# Patient Record
Sex: Female | Born: 1940 | Race: White | Hispanic: No | Smoking: Current every day smoker
Health system: Southern US, Community
[De-identification: ages and names within clinical notes are randomized; demographics above are authoritative.]

## PROBLEM LIST (undated history)

## (undated) DIAGNOSIS — R0602 Shortness of breath: Secondary | ICD-10-CM

## (undated) DIAGNOSIS — T4145XA Adverse effect of unspecified anesthetic, initial encounter: Secondary | ICD-10-CM

## (undated) DIAGNOSIS — G039 Meningitis, unspecified: Secondary | ICD-10-CM

## (undated) DIAGNOSIS — Z8719 Personal history of other diseases of the digestive system: Secondary | ICD-10-CM

## (undated) DIAGNOSIS — M810 Age-related osteoporosis without current pathological fracture: Principal | ICD-10-CM

## (undated) DIAGNOSIS — Z860100 Personal history of colon polyps, unspecified: Secondary | ICD-10-CM

## (undated) DIAGNOSIS — E785 Hyperlipidemia, unspecified: Secondary | ICD-10-CM

## (undated) DIAGNOSIS — K279 Peptic ulcer, site unspecified, unspecified as acute or chronic, without hemorrhage or perforation: Secondary | ICD-10-CM

## (undated) DIAGNOSIS — C801 Malignant (primary) neoplasm, unspecified: Secondary | ICD-10-CM

## (undated) DIAGNOSIS — I2510 Atherosclerotic heart disease of native coronary artery without angina pectoris: Secondary | ICD-10-CM

## (undated) DIAGNOSIS — T8859XA Other complications of anesthesia, initial encounter: Secondary | ICD-10-CM

## (undated) DIAGNOSIS — M25473 Effusion, unspecified ankle: Secondary | ICD-10-CM

## (undated) DIAGNOSIS — I10 Essential (primary) hypertension: Secondary | ICD-10-CM

## (undated) HISTORY — DX: Meningitis, unspecified: G03.9

## (undated) HISTORY — PX: CHOLECYSTECTOMY: SHX55

## (undated) HISTORY — DX: Personal history of other diseases of the digestive system: Z87.19

## (undated) HISTORY — DX: Shortness of breath: R06.02

## (undated) HISTORY — DX: Essential (primary) hypertension: I10

## (undated) HISTORY — DX: Hyperlipidemia, unspecified: E78.5

## (undated) HISTORY — DX: Peptic ulcer, site unspecified, unspecified as acute or chronic, without hemorrhage or perforation: K27.9

## (undated) HISTORY — PX: MIDDLE EAR SURGERY: SHX713

## (undated) HISTORY — PX: ABDOMINAL HYSTERECTOMY: SHX81

## (undated) HISTORY — DX: Personal history of colon polyps, unspecified: Z86.0100

## (undated) HISTORY — DX: Personal history of colonic polyps: Z86.010

## (undated) HISTORY — PX: GASTRIC BYPASS: SHX52

## (undated) HISTORY — PX: OTHER SURGICAL HISTORY: SHX169

## (undated) HISTORY — PX: LAPAROSCOPIC GASTRIC BANDING: SHX1100

## (undated) HISTORY — PX: PACEMAKER PLACEMENT: SHX43

## (undated) HISTORY — DX: Age-related osteoporosis without current pathological fracture: M81.0

## (undated) HISTORY — PX: APPENDECTOMY: SHX54

## (undated) HISTORY — DX: Atherosclerotic heart disease of native coronary artery without angina pectoris: I25.10

## (undated) HISTORY — DX: Malignant (primary) neoplasm, unspecified: C80.1

---
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
## 1992-04-05 DIAGNOSIS — G039 Meningitis, unspecified: Secondary | ICD-10-CM

## 1992-04-05 HISTORY — DX: Meningitis, unspecified: G03.9

## 2003-04-06 DIAGNOSIS — C801 Malignant (primary) neoplasm, unspecified: Secondary | ICD-10-CM

## 2003-04-06 HISTORY — DX: Malignant (primary) neoplasm, unspecified: C80.1

## 2003-04-06 HISTORY — PX: MELANOMA EXCISION: SHX5266

## 2010-03-06 LAB — PULMONARY FUNCTION TEST

## 2010-04-05 HISTORY — PX: VARICOSE VEIN SURGERY: SHX832

## 2011-12-14 ENCOUNTER — Encounter: Payer: Self-pay | Admitting: *Deleted

## 2012-01-07 ENCOUNTER — Encounter (INDEPENDENT_AMBULATORY_CARE_PROVIDER_SITE_OTHER): Payer: Medicare HMO | Admitting: *Deleted

## 2012-01-07 ENCOUNTER — Encounter: Payer: Self-pay | Admitting: Cardiovascular Disease

## 2012-01-07 ENCOUNTER — Ambulatory Visit (INDEPENDENT_AMBULATORY_CARE_PROVIDER_SITE_OTHER): Payer: Medicare HMO | Admitting: Cardiovascular Disease

## 2012-01-07 VITALS — BP 149/96 | HR 86 | Resp 18 | Ht 64.0 in | Wt 168.1 lb

## 2012-01-07 DIAGNOSIS — I498 Other specified cardiac arrhythmias: Secondary | ICD-10-CM

## 2012-01-07 DIAGNOSIS — I2510 Atherosclerotic heart disease of native coronary artery without angina pectoris: Secondary | ICD-10-CM

## 2012-01-07 DIAGNOSIS — E7800 Pure hypercholesterolemia, unspecified: Secondary | ICD-10-CM

## 2012-01-07 DIAGNOSIS — R001 Bradycardia, unspecified: Secondary | ICD-10-CM

## 2012-01-07 DIAGNOSIS — I10 Essential (primary) hypertension: Secondary | ICD-10-CM

## 2012-01-07 DIAGNOSIS — I495 Sick sinus syndrome: Secondary | ICD-10-CM

## 2012-01-07 DIAGNOSIS — Z950 Presence of cardiac pacemaker: Secondary | ICD-10-CM

## 2012-01-07 LAB — PACEMAKER DEVICE OBSERVATION
AL AMPLITUDE: 2 mv
AL IMPEDENCE PM: 686 Ohm
RV LEAD IMPEDENCE PM: 533 Ohm
RV LEAD THRESHOLD: 1 V
VENTRICULAR PACING PM: 6

## 2012-01-07 MED ORDER — ATORVASTATIN CALCIUM 10 MG PO TABS: 10.0000 mg | ORAL_TABLET | Freq: Every day | ORAL | Status: DC

## 2012-01-07 MED ORDER — LISINOPRIL 20 MG PO TABS: 20.0000 mg | ORAL_TABLET | Freq: Every day | ORAL | Status: DC

## 2012-01-07 MED ORDER — LANSOPRAZOLE 30 MG PO CPDR: 30.0000 mg | DELAYED_RELEASE_CAPSULE | Freq: Two times a day (BID) | ORAL | Status: DC

## 2012-01-07 MED ORDER — ATENOLOL 25 MG PO TABS: 25.0000 mg | ORAL_TABLET | Freq: Every day | ORAL | Status: DC

## 2012-01-07 NOTE — Patient Instructions (Addendum)
Your physician has requested that you have a carotid duplex. This test is an ultrasound of the carotid arteries in your neck. It looks at blood flow through these arteries that supply the brain with blood. Allow one hour for this exam. There are no restrictions or special instructions.  Your physician recommends that you return for a FASTING LIPID, LIVER, TSH, BMP and CBC--nothing to eat or drink after midnight, lab opens at 8:30  You have been referred to Dr Ladona Ridgel to establish for pacemaker follow-up.  Your physician has requested that you regularly monitor and record your blood pressure readings at home. Please use the same machine at the same time of day to check your readings and record them to bring to your follow-up visit. PLEASE CHECK YOUR BP THREE TIMES PER WEEK AND CONTACT THE OFFICE IF IT IS CONSISTENTLY ABOVE 140/90.  Your physician recommends that you schedule a follow-up appointment in: 3 MONTHS

## 2012-01-12 ENCOUNTER — Encounter: Payer: Self-pay | Admitting: Cardiovascular Disease

## 2012-01-25 ENCOUNTER — Other Ambulatory Visit (INDEPENDENT_AMBULATORY_CARE_PROVIDER_SITE_OTHER): Payer: Medicare HMO

## 2012-01-25 ENCOUNTER — Encounter (INDEPENDENT_AMBULATORY_CARE_PROVIDER_SITE_OTHER): Payer: Medicare HMO

## 2012-01-25 DIAGNOSIS — I6529 Occlusion and stenosis of unspecified carotid artery: Secondary | ICD-10-CM

## 2012-01-25 DIAGNOSIS — E7800 Pure hypercholesterolemia, unspecified: Secondary | ICD-10-CM

## 2012-01-25 DIAGNOSIS — I2510 Atherosclerotic heart disease of native coronary artery without angina pectoris: Secondary | ICD-10-CM

## 2012-01-25 DIAGNOSIS — I10 Essential (primary) hypertension: Secondary | ICD-10-CM

## 2012-01-25 LAB — BASIC METABOLIC PANEL
BUN: 16 mg/dL (ref 6–23)
CO2: 26 mEq/L (ref 19–32)
Calcium: 8.7 mg/dL (ref 8.4–10.5)
Chloride: 108 mEq/L (ref 96–112)
Creatinine, Ser: 0.8 mg/dL (ref 0.4–1.2)

## 2012-01-25 LAB — CBC WITH DIFFERENTIAL/PLATELET
Basophils Absolute: 0.1 10*3/uL (ref 0.0–0.1)
Basophils Relative: 0.6 % (ref 0.0–3.0)
Eosinophils Absolute: 0.1 10*3/uL (ref 0.0–0.7)
HCT: 38.3 % (ref 36.0–46.0)
Hemoglobin: 12.3 g/dL (ref 12.0–15.0)
Lymphocytes Relative: 25.4 % (ref 12.0–46.0)
Lymphs Abs: 2 10*3/uL (ref 0.7–4.0)
MCHC: 32.1 g/dL (ref 30.0–36.0)
Neutro Abs: 5.2 10*3/uL (ref 1.4–7.7)
RBC: 4.71 Mil/uL (ref 3.87–5.11)
RDW: 15.4 % — ABNORMAL HIGH (ref 11.5–14.6)

## 2012-01-25 LAB — HEPATIC FUNCTION PANEL
Bilirubin, Direct: 0.1 mg/dL (ref 0.0–0.3)
Total Bilirubin: 0.2 mg/dL — ABNORMAL LOW (ref 0.3–1.2)

## 2012-01-25 LAB — LIPID PANEL
HDL: 42.4 mg/dL (ref >39.00)
LDL Cholesterol: 89 mg/dL (ref 0–99)
Total CHOL/HDL Ratio: 4
Triglycerides: 91 mg/dL (ref 0.0–149.0)
VLDL: 18.2 mg/dL (ref 0.0–40.0)

## 2012-01-27 ENCOUNTER — Telehealth: Payer: Self-pay | Admitting: Cardiovascular Disease

## 2012-01-27 NOTE — Telephone Encounter (Signed)
New Problem:    Patient is calling in because her BP 160/110 this morning and has been over 140/90 for the past two weeks.  Please call back.

## 2012-01-27 NOTE — Telephone Encounter (Signed)
I spoke with the pt and she has been monitoring her BP over the last few weeks.  Per the pt the lowest BP was 155/105.  Dr Excell Seltzer is currently out of the office until next week.  I will see if or PA can make further medication recommendations.

## 2012-01-28 MED ORDER — AMLODIPINE BESYLATE 5 MG PO TABS: 5.0000 mg | ORAL_TABLET | Freq: Every day | ORAL | Status: DC

## 2012-01-28 NOTE — Telephone Encounter (Signed)
Start Amlodipine 5 mg QD. Tereso Newcomer, PA-C  10:29 AM 01/28/2012

## 2012-01-28 NOTE — Telephone Encounter (Signed)
I spoke with the pt and made her aware that she needs to start Amlodipine 5mg  daily for BP in addition to her other medications.  Rx sent to pharmacy.  The pt will continue to monitor her BP at home and I did make her aware of side effects of Amlodipine.

## 2012-01-31 ENCOUNTER — Encounter: Payer: Self-pay | Admitting: Cardiovascular Disease

## 2012-01-31 ENCOUNTER — Encounter: Payer: Self-pay | Admitting: Internal Medicine

## 2012-01-31 DIAGNOSIS — R001 Bradycardia, unspecified: Secondary | ICD-10-CM | POA: Insufficient documentation

## 2012-01-31 DIAGNOSIS — I10 Essential (primary) hypertension: Secondary | ICD-10-CM | POA: Insufficient documentation

## 2012-01-31 DIAGNOSIS — Z950 Presence of cardiac pacemaker: Secondary | ICD-10-CM | POA: Insufficient documentation

## 2012-01-31 NOTE — Progress Notes (Signed)
HPI:  71 year old woman presents today to establish cardiac care. She has lived in [place] and [place], and recently has relocated to [place] to be closer to family. The patient has a history of nonobstructive coronary artery disease. She underwent intravascular ultrasound of the right coronary artery in 2011 demonstrating 50% stenosis. She's been managed medically. She also has a long-standing history of hypertension and an echo has shown decreased left ventricular compliance. She has mild carotid stenosis with bilateral 30-49% ICA stenosis. Finally, she's had a history of atrial fibrillation with permanent pacemaker placement in 2009 and redo pacemaker placement in 2010.  From a symptomatic perspective, the patient is doing fairly well. She's had no chest pain or pressure. She denies dyspnea, orthopnea, or PND. She's had no recent lightheadedness, or syncope, but does admit to occasional palpitations. She does have a history of swelling in her ankles and has been diagnosed with venous reflux disease. She has some nocturnal leg cramps. She has not engaged in any regular exercise.  The patient has been a smoker on and off for many years. She's been smoking again for the past 3 months. She drinks alcohol socially.  Outpatient Encounter Prescriptions as of 01/07/2012  Medication Sig Dispense Refill  . aspirin 81 MG tablet Take 81 mg by mouth daily.      Marland Kitchen atenolol (TENORMIN) 25 MG tablet Take 1 tablet (25 mg total) by mouth daily.  90 tablet  3  . atorvastatin (LIPITOR) 10 MG tablet Take 1 tablet (10 mg total) by mouth daily.  90 tablet  3  . lansoprazole (PREVACID) 30 MG capsule Take 1 capsule (30 mg total) by mouth 2 (two) times daily.  60 capsule  1  . lisinopril (PRINIVIL,ZESTRIL) 20 MG tablet Take 1 tablet (20 mg total) by mouth daily.  90 tablet  3  . DISCONTD: atenolol (TENORMIN) 25 MG tablet Take 25 mg by mouth daily.      Marland Kitchen DISCONTD: atorvastatin (LIPITOR) 10 MG tablet Take 10 mg by  mouth daily.      Marland Kitchen DISCONTD: lansoprazole (PREVACID) 15 MG capsule Take 30 mg by mouth 2 (two) times daily.       Marland Kitchen DISCONTD: lansoprazole (PREVACID) 30 MG capsule Take 1 capsule (30 mg total) by mouth 2 (two) times daily.  180 capsule  3  . DISCONTD: lisinopril (PRINIVIL,ZESTRIL) 20 MG tablet Take 20 mg by mouth daily.      . fish oil-omega-3 fatty acids 1000 MG capsule Take 2 g by mouth daily.        Review of patient's allergies indicates no known allergies.  Past Medical History  Diagnosis Date  . HTN (hypertension)   . Meningitis     with right ear complete deafness  . History of gastroesophageal reflux (GERD)   . Peptic ulcer disease   . CAD (coronary artery disease)   . SOB (shortness of breath)     Past Surgical History  Procedure Date  . Middle ear surgery   . Abdominal hysterectomy   . Cholecystectomy   . Appendectomy   . Bilateral breast implants   . Laparoscopic gastric banding   . Gastric bypass     History   Social History  . Marital Status: Divorced    Spouse Name: N/A    Number of Children: N/A  . Years of Education: N/A   Occupational History  . Not on file.   Social History Main Topics  . Smoking status: Former Games developer  . Smokeless  tobacco: Not on file  . Alcohol Use: Yes  . Drug Use: No  . Sexually Active: Not on file   Other Topics Concern  . Not on file   Social History Narrative  . No narrative on file    Family History  Problem Relation Age of Onset  . Heart attack Mother   . Ulcers Father   . Hypertension Brother   . Hypertension Sister   . Stroke Maternal Grandmother    ROS:  General: no fevers/chills/night sweats Eyes: no blurry vision, diplopia, or amaurosis ENT: no sore throat or hearing loss Resp: no cough, wheezing, or hemoptysis CV: no edema or palpitations GI: no abdominal pain, nausea, vomiting, diarrhea, or constipation. Positive for dyspepsia GU: no dysuria, frequency, or hematuria Skin: no rash. Positive for  skin cancers Neuro: Positive for headache, negative for numbness, tingling, or weakness of extremities Musculoskeletal: Positive for hip and knee pains Heme: no bleeding, DVT, or easy bruising Endo: no polydipsia or polyuria  BP 149/96  Pulse 86  Resp 18  Ht 5\' 4"  (1.626 m)  Wt 76.259 kg (168 lb 1.9 oz)  BMI 28.86 kg/m2  SpO2 96%  PHYSICAL EXAM: Pt is alert and oriented, WD, WN, in no distress. HEENT: normal Neck: JVP normal. Carotid upstrokes normal with a left carotid bruit. No thyromegaly. Lungs: equal expansion, clear bilaterally CV: Apex is discrete and nondisplaced, RRR without murmur or gallop Abd: soft, NT, +BS, no bruit, no hepatosplenomegaly Back: no CVA tenderness Ext: no C/C/E        Femoral pulses 2+= without bruits        DP/PT pulses intact and = Skin: warm and dry without rash Neuro: CNII-XII intact             Strength intact = bilaterally  EKG:  Electronic atrial pacemaker 79 beats per minute, low-voltage QRS, otherwise within normal limits.  ASSESSMENT AND PLAN: 1. Essential hypertension: Patient's blood pressure appears to be suboptimally controlled based on her office visit today. I've asked her to use her home cuff and record blood pressures 3 times per week. She will call in with readings in 2 weeks. Refills for atenolol and lisinopril will be written. The patient should have a metabolic panel and full laboratory evaluation which will be ordered.  2. Asymptomatic carotid stenosis: Outside carotid ultrasound scans were reviewed and they have demonstrated 30-49% stenosis. The last study on file is from 2011. This should be repeated to evaluate for progression of disease.  3. History of "sick sinus syndrome" with permanent pacemaker placement. The patient will be referred to Dr. Ladona Ridgel for electrophysiology evaluation and permanent pacemaker followup.  4. Coronary artery disease, native vessel. Outside records reviewed demonstrating 50% RCA stenosis. The  patient is stable without anginal symptoms. She's on antiplatelet therapy with aspirin as well as a statin drug. Lipids and LFTs will be checked.  5. Hyperlipidemia: The patient is on atorvastatin and will undergo lipid testing.  Tonny Bollman 01/31/2012 6:35 AM

## 2012-02-01 ENCOUNTER — Encounter: Payer: Self-pay | Admitting: Family

## 2012-02-01 ENCOUNTER — Ambulatory Visit (INDEPENDENT_AMBULATORY_CARE_PROVIDER_SITE_OTHER): Payer: Medicare HMO | Admitting: Family

## 2012-02-01 VITALS — BP 136/92 | HR 86 | Temp 98.2°F | Resp 16 | Ht 63.25 in | Wt 157.0 lb

## 2012-02-01 DIAGNOSIS — R001 Bradycardia, unspecified: Secondary | ICD-10-CM

## 2012-02-01 DIAGNOSIS — K635 Polyp of colon: Secondary | ICD-10-CM

## 2012-02-01 DIAGNOSIS — I498 Other specified cardiac arrhythmias: Secondary | ICD-10-CM

## 2012-02-01 DIAGNOSIS — E785 Hyperlipidemia, unspecified: Secondary | ICD-10-CM

## 2012-02-01 DIAGNOSIS — D126 Benign neoplasm of colon, unspecified: Secondary | ICD-10-CM

## 2012-02-01 DIAGNOSIS — I10 Essential (primary) hypertension: Secondary | ICD-10-CM

## 2012-02-01 DIAGNOSIS — Z23 Encounter for immunization: Secondary | ICD-10-CM

## 2012-02-01 DIAGNOSIS — Z86010 Personal history of colonic polyps: Secondary | ICD-10-CM

## 2012-02-01 NOTE — Progress Notes (Signed)
Subjective:    Patient ID: Linda Lane, female    DOB: [birth year], 71 y.o.   MRN: [id number]  HPI  Ms Linda Lane is a 71 yr old female who presents today to establish care.  Reports hx of colon polyps.  Reports non-cancerous.  Last colo was 2006.   HTN- reports that she recently had bp reading at home of 213/117. + compliance with meds.  Hyperlipidemia- takes atorvastatin.  Denies myalgia.   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    Review of Systems  Constitutional: Negative for unexpected weight change.  HENT: Negative for congestion.        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    Eyes: Negative for visual disturbance.  Respiratory: Negative for cough and shortness of breath.   Cardiovascular: Negative for chest pain.  Gastrointestinal: Negative for nausea, vomiting and diarrhea.       Some lower abdominal cramping which she attributes to gas.  Genitourinary: Negative for dysuria, urgency and frequency.  Musculoskeletal:       Some left wrist pain- wears a brace.  This helps  Skin: Negative for rash.  Neurological:       Occasional HA's which she has attributed to elevated BP readings.   Hematological: Negative for adenopathy.  Psychiatric/Behavioral:       She denies depression and anxiety   Past Medical History  Diagnosis Date  . HTN (hypertension)   . Meningitis     with right ear complete deafness  . History of gastroesophageal reflux (GERD)   . Peptic ulcer disease   . CAD (coronary artery disease)   . SOB (shortness of breath)   . Hyperlipidemia   . History of colon polyps   . Cancer 2005    history of melanoma    History   Social History  . Marital Status: Divorced    Spouse Name: N/A    Number of Children: N/A  . Years of Education: N/A   Occupational History  . Not on file.   Social History Main Topics  . Smoking status: Former Games developer  . Smokeless tobacco: Not on file  . Alcohol Use: Yes  . Drug Use: No  . Sexually  Active: Not on file   Other Topics Concern  . Not on file   Social History Narrative  . No narrative on file    Past Surgical History  Procedure Date  . Middle ear surgery   . Abdominal hysterectomy   . Cholecystectomy   . Appendectomy   . Bilateral breast implants   . Laparoscopic gastric banding   . Gastric bypass   . Tonsillectomy 1962  . Pacemaker placement 1999, 2007, 2010  . Melanoma excision 2005    beneath chin    Family History  Problem Relation Age of Onset  . Heart attack Mother   . Stroke Mother   . Ulcers Father   . Stroke Father   . Hypertension Brother   . Hypertension Sister   . Heart disease Maternal Grandmother   . Heart disease Maternal Grandfather     No Known Allergies  Current Outpatient Prescriptions on File Prior to Visit  Medication Sig Dispense Refill  . amLODipine (NORVASC) 5 MG tablet Take 1 tablet (5 mg total) by mouth daily.  90 tablet  3  . aspirin 81 MG tablet Take 81 mg by mouth daily.      Marland Kitchen atenolol (TENORMIN) 25 MG tablet Take 1 tablet (25 mg total) by  mouth daily.  90 tablet  3  . atorvastatin (LIPITOR) 10 MG tablet Take 1 tablet (10 mg total) by mouth daily.  90 tablet  3  . lansoprazole (PREVACID) 30 MG capsule Take 1 capsule (30 mg total) by mouth 2 (two) times daily.  60 capsule  1  . lisinopril (PRINIVIL,ZESTRIL) 20 MG tablet Take 1 tablet (20 mg total) by mouth daily.  90 tablet  3  . fish oil-omega-3 fatty acids 1000 MG capsule Take 2 g by mouth daily.        BP 136/92  Pulse 86  Temp 98.2 F (36.8 C) (Oral)  Resp 16  Ht 5' 3.25" (1.607 m)  Wt 157 lb (71.215 kg)  BMI 27.59 kg/m2  SpO2 94%  LMP 04/05/1988       Objective:   Physical Exam  Constitutional: She is oriented to person, place, and time. She appears well-developed and well-nourished. No distress.  HENT:  Head: Normocephalic and atraumatic.  Eyes: No scleral icterus.  Cardiovascular: Normal rate and regular rhythm.   No murmur  heard. Pulmonary/Chest: Effort normal and breath sounds normal. No respiratory distress. She has no wheezes. She has no rales. She exhibits no tenderness.  Abdominal: Soft.  Musculoskeletal: She exhibits no edema.  Lymphadenopathy:    She has no cervical adenopathy.  Neurological: She is alert and oriented to person, place, and time.  Psychiatric: She has a normal mood and affect. Her behavior is normal. Judgment and thought content normal.          Assessment & Plan:

## 2012-02-01 NOTE — Patient Instructions (Addendum)
You will be contact about your referral for colonoscopy.  Please let us know if you have not heard back within 1 week about your referral. Please schedule a follow up appointment in 3 months- for a medicare wellness visit. Welcome to [facility]!

## 2012-02-03 DIAGNOSIS — Z860100 Personal history of colon polyps, unspecified: Secondary | ICD-10-CM | POA: Insufficient documentation

## 2012-02-03 DIAGNOSIS — E785 Hyperlipidemia, unspecified: Secondary | ICD-10-CM | POA: Insufficient documentation

## 2012-02-03 NOTE — Assessment & Plan Note (Addendum)
BP Readings from Last 3 Encounters:  02/01/12 136/92  01/07/12 149/96   BP looks good today. Continue amlodipine, atenolol and lisinopril. I have asked her to keep an eye on her bp at home and call us if she has additional high readings.

## 2012-02-03 NOTE — Assessment & Plan Note (Signed)
Refer to GI for follow up.

## 2012-02-03 NOTE — Assessment & Plan Note (Signed)
Has PPM- managed by cardiology. Clinically stable.

## 2012-02-03 NOTE — Assessment & Plan Note (Signed)
Tolerating statin. LDL 89 last week.  Continue same.

## 2012-02-04 ENCOUNTER — Encounter: Payer: Self-pay | Admitting: Gastroenterology

## 2012-02-09 ENCOUNTER — Telehealth: Payer: Self-pay | Admitting: *Deleted

## 2012-02-09 ENCOUNTER — Ambulatory Visit (AMBULATORY_SURGERY_CENTER): Payer: Medicare HMO | Admitting: *Deleted

## 2012-02-09 VITALS — Ht 64.0 in | Wt 155.4 lb

## 2012-02-09 DIAGNOSIS — Z1211 Encounter for screening for malignant neoplasm of colon: Secondary | ICD-10-CM

## 2012-02-09 DIAGNOSIS — Z86010 Personal history of colonic polyps: Secondary | ICD-10-CM

## 2012-02-09 MED ORDER — MOVIPREP 100 G PO SOLR: 1.0000 | Freq: Once | ORAL | Status: DC

## 2012-02-09 NOTE — Progress Notes (Signed)
Pt denies egg or soy allergy. ewm

## 2012-02-09 NOTE — Telephone Encounter (Signed)
Release has been faxed  

## 2012-02-09 NOTE — Telephone Encounter (Signed)
Ok, thanks  [place], can you double check to make sure that those records from [place] get here.  I want to make sure colonoscopy is really indicated.  thanks

## 2012-02-09 NOTE — Telephone Encounter (Signed)
Dr Linda Lane, this pt came in for a previsit and refused moderate sedation as she has failed  the last two colonoscopies with moderate sedation per the pt. She states she woke up with pain and she wasn't going to do another colon without deeper sedation. i did reschedule her for 11-27 this is a propofol day for you but just wanted to inform you the reason for this.  I  also had her sign a records release to obtain her last 2 colons from her Gi doc in [place] as she states this is where they were done. i gave the release to patty. Thanks for your time and please let me know if i need to change anything for this pt.  Linda Lane

## 2012-02-15 ENCOUNTER — Encounter: Payer: Self-pay | Admitting: *Deleted

## 2012-02-18 ENCOUNTER — Other Ambulatory Visit: Payer: Medicare HMO | Admitting: Gastroenterology

## 2012-02-22 ENCOUNTER — Ambulatory Visit (INDEPENDENT_AMBULATORY_CARE_PROVIDER_SITE_OTHER): Payer: Medicare HMO | Admitting: Internal Medicine

## 2012-02-22 ENCOUNTER — Encounter: Payer: Self-pay | Admitting: Internal Medicine

## 2012-02-22 VITALS — BP 153/90 | HR 101 | Resp 18 | Ht 64.0 in | Wt 160.0 lb

## 2012-02-22 DIAGNOSIS — I10 Essential (primary) hypertension: Secondary | ICD-10-CM

## 2012-02-22 DIAGNOSIS — Z950 Presence of cardiac pacemaker: Secondary | ICD-10-CM

## 2012-02-22 DIAGNOSIS — R001 Bradycardia, unspecified: Secondary | ICD-10-CM

## 2012-02-22 DIAGNOSIS — I498 Other specified cardiac arrhythmias: Secondary | ICD-10-CM

## 2012-02-22 LAB — PACEMAKER DEVICE OBSERVATION
AL AMPLITUDE: 2 mv
ATRIAL PACING PM: 84
BATTERY VOLTAGE: 2.77 V
RV LEAD IMPEDENCE PM: 573 Ohm
VENTRICULAR PACING PM: 3

## 2012-02-22 NOTE — Progress Notes (Signed)
HPI Linda Lane is referred today for ongoing evaluation and management of her permanent PM. She is a pleasant 71 yo woman with a h/o HTN, dyslipidemia, who is status post permanent pacemaker insertion in 2010. Since then, she has had no recurrent syncope. She denies palpitations, chest pain, or shortness of breath. She does admit to some sodium indiscretion. No Known Allergies   Current Outpatient Prescriptions  Medication Sig Dispense Refill  . amLODipine (NORVASC) 5 MG tablet Take 5 mg by mouth daily.       Marland Kitchen aspirin 81 MG tablet Take 81 mg by mouth daily.      Marland Kitchen atenolol (TENORMIN) 25 MG tablet Take 1 tablet (25 mg total) by mouth daily.  90 tablet  3  . atorvastatin (LIPITOR) 10 MG tablet Take 1 tablet (10 mg total) by mouth daily.  90 tablet  3  . lansoprazole (PREVACID) 30 MG capsule Take 1 capsule (30 mg total) by mouth 2 (two) times daily.  60 capsule  1  . lisinopril (PRINIVIL,ZESTRIL) 20 MG tablet Take 1 tablet (20 mg total) by mouth daily.  90 tablet  3  . MOVIPREP 100 G SOLR Take 1 kit (100 g total) by mouth once. moviprep as directed. No substitutions  1 kit  0     Past Medical History  Diagnosis Date  . HTN (hypertension)   . Meningitis     with right ear complete deafness  . History of gastroesophageal reflux (GERD)   . Peptic ulcer disease   . CAD (coronary artery disease)   . SOB (shortness of breath)   . Hyperlipidemia   . History of colon polyps   . Cancer 2005    history of melanoma    ROS:   All systems reviewed and negative except as noted in the HPI.   Past Surgical History  Procedure Date  . Middle ear surgery   . Abdominal hysterectomy   . Cholecystectomy   . Appendectomy   . Bilateral breast implants   . Laparoscopic gastric banding   . Gastric bypass   . Tonsillectomy 1962  . Pacemaker placement 1999, 2007, 2010  . Melanoma excision 2005    beneath chin     Family History  Problem Relation Age of Onset  . Heart attack Mother   .  Stroke Mother   . Ulcers Father   . Stroke Father   . Hypertension Brother   . Hypertension Sister   . Heart disease Maternal Grandmother   . Heart disease Maternal Grandfather   . Colon cancer Neg Hx      History   Social History  . Marital Status: Divorced    Spouse Name: N/A    Number of Children: N/A  . Years of Education: N/A   Occupational History  . Not on file.   Social History Main Topics  . Smoking status: Current Every Day Smoker -- 0.5 packs/day    Types: Cigarettes  . Smokeless tobacco: Former Neurosurgeon  . Alcohol Use: 1.0 - 1.5 oz/week    2-3 drink(s) per week  . Drug Use: No  . Sexually Active: Not on file   Other Topics Concern  . Not on file   Social History Narrative  . No narrative on file     BP 153/90  Pulse 101  Resp 18  Ht 5\' 4"  (1.626 m)  Wt 160 lb (72.576 kg)  BMI 27.46 kg/m2  LMP 04/05/1988  Physical Exam:  Well appearing 71 year old woman, NAD  HEENT: Unremarkable Neck:  No JVD, no thyromegally Lungs:  Clear with no wheezes, rales, or rhonchi. HEART:  Regular rate rhythm, no murmurs, no rubs, no clicks Abd:  soft, positive bowel sounds, no organomegally, no rebound, no guarding Ext:  2 plus pulses, no edema, no cyanosis, no clubbing Skin:  No rashes no nodules Neuro:  CN II through XII intact, motor grossly intact  DEVICE  Normal device function.  See PaceArt for details.   Assess/Plan:

## 2012-02-22 NOTE — Assessment & Plan Note (Signed)
Her blood pressure is elevated slightly. I've asked the patient to work on reducing her salt intake. She may ultimately require additional antihypertensive medical therapy.

## 2012-02-22 NOTE — Assessment & Plan Note (Signed)
Her permanent pacemaker is working normally. We'll plan to recheck in several months. 

## 2012-02-22 NOTE — Patient Instructions (Signed)
Your physician wants you to follow-up in: 12 months with Dr Taylor You will receive a reminder letter in the mail two months in advance. If you don't receive a letter, please call our office to schedule the follow-up appointment.   Remote monitoring is used to monitor your Pacemaker of ICD from home. This monitoring reduces the number of office visits required to check your device to one time per year. It allows us to keep an eye on the functioning of your device to ensure it is working properly. You are scheduled for a device check from home on 05/29/12 You may send your transmission at any time that day. If you have a wireless device, the transmission will be sent automatically. After your physician reviews your transmission, you will receive a postcard with your next transmission date.   

## 2012-02-29 ENCOUNTER — Telehealth: Payer: Self-pay | Admitting: Gastroenterology

## 2012-02-29 NOTE — Telephone Encounter (Signed)
I reviewed a large packet of information from [place].  There was extensive documentation about previous UGI bleed from anastomotic ulcers in 2005 however the colonoscopy reports which we have been asking for were not in the packet.  There was a pathology report included, dated 08/2002 that shows she had a 2mm tubular adenoma removed from her right colon.  I don't know if she has had any colonoscopies SINCE 2004.  We have really tried to get proper records here but those are still lacking.  Should continue with plans for colonoscopy tomorrow for history of adenomatous polyps.

## 2012-03-01 ENCOUNTER — Encounter: Payer: Self-pay | Admitting: Gastroenterology

## 2012-03-01 ENCOUNTER — Ambulatory Visit (AMBULATORY_SURGERY_CENTER): Payer: Medicare HMO | Admitting: Gastroenterology

## 2012-03-01 VITALS — BP 135/78 | HR 60 | Temp 96.6°F | Resp 15 | Ht 64.0 in | Wt 160.0 lb

## 2012-03-01 DIAGNOSIS — Z1211 Encounter for screening for malignant neoplasm of colon: Secondary | ICD-10-CM

## 2012-03-01 DIAGNOSIS — K5730 Diverticulosis of large intestine without perforation or abscess without bleeding: Secondary | ICD-10-CM

## 2012-03-01 DIAGNOSIS — D126 Benign neoplasm of colon, unspecified: Secondary | ICD-10-CM

## 2012-03-01 DIAGNOSIS — Z86010 Personal history of colonic polyps: Secondary | ICD-10-CM

## 2012-03-01 MED ORDER — SODIUM CHLORIDE 0.9 % IV SOLN: 500.0000 mL | INTRAVENOUS | Status: DC

## 2012-03-01 NOTE — Patient Instructions (Addendum)
Handouts were given to your care partner on diverticulosis,  High fiber diet and polyps.  You may resume your cauurent medications today.  Please call if any questions or concerns.     YOU HAD AN ENDOSCOPIC PROCEDURE TODAY AT THE [facility]: Refer to the procedure report that was given to you for any specific questions about what was found during the examination.  If the procedure report does not answer your questions, please call your gastroenterologist to clarify.  If you requested that your care partner not be given the details of your procedure findings, then the procedure report has been included in a sealed envelope for you to review at your convenience later.  YOU SHOULD EXPECT: Some feelings of bloating in the abdomen. Passage of more gas than usual.  Walking can help get rid of the air that was put into your GI tract during the procedure and reduce the bloating. If you had a lower endoscopy (such as a colonoscopy or flexible sigmoidoscopy) you may notice spotting of blood in your stool or on the toilet paper. If you underwent a bowel prep for your procedure, then you may not have a normal bowel movement for a few days.  DIET: Your first meal following the procedure should be a light meal and then it is ok to progress to your normal diet.  A half-sandwich or bowl of soup is an example of a good first meal.  Heavy or fried foods are harder to digest and may make you feel nauseous or bloated.  Likewise meals heavy in dairy and vegetables can cause extra gas to form and this can also increase the bloating.  Drink plenty of fluids but you should avoid alcoholic beverages for 24 hours.  ACTIVITY: Your care partner should take you home directly after the procedure.  You should plan to take it easy, moving slowly for the rest of the day.  You can resume normal activity the day after the procedure however you should NOT DRIVE or use heavy machinery for 24 hours (because of the sedation  medicines used during the test).    SYMPTOMS TO REPORT IMMEDIATELY: A gastroenterologist can be reached at any hour.  During normal business hours, 8:30 AM to 5:00 PM Monday through Friday, call [phone].  After hours and on weekends, please call the GI answering service at [phone] who will take a message and have the physician on call contact you.   Following lower endoscopy (colonoscopy or flexible sigmoidoscopy):  Excessive amounts of blood in the stool  Significant tenderness or worsening of abdominal pains  Swelling of the abdomen that is new, acute  Fever of 100F or higher    FOLLOW UP: If any biopsies were taken you will be contacted by phone or by letter within the next 1-3 weeks.  Call your gastroenterologist if you have not heard about the biopsies in 3 weeks.  Our staff will call the home number listed on your records the next business day following your procedure to check on you and address any questions or concerns that you may have at that time regarding the information given to you following your procedure. This is a courtesy call and so if there is no answer at the home number and we have not heard from you through the emergency physician on call, we will assume that you have returned to your regular daily activities without incident.  SIGNATURES/CONFIDENTIALITY: You and/or your care partner have signed paperwork which will be  entered into your electronic medical record.  These signatures attest to the fact that that the information above on your After Visit Summary has been reviewed and is understood.  Full responsibility of the confidentiality of this discharge information lies with you and/or your care-partner.

## 2012-03-01 NOTE — Progress Notes (Signed)
The pt had no complaints noted in the recovery. maw

## 2012-03-01 NOTE — Progress Notes (Signed)
No complaints noted in the recovery room. Maw  Patient did not experience any of the following events: a burn prior to discharge; a fall within the facility; wrong site/side/patient/procedure/implant event; or a hospital transfer or hospital admission upon discharge from the facility. (G8907) Patient did not have preoperative order for IV antibiotic SSI prophylaxis. (G8918)  

## 2012-03-01 NOTE — Progress Notes (Signed)
Called to room to assist during endoscopic procedure.  Patient ID and intended procedure confirmed with present staff. Received instructions for my participation in the procedure from the performing physician. ewm 

## 2012-03-01 NOTE — Progress Notes (Signed)
[id number] a/ox3 pleased report to [employer]

## 2012-03-01 NOTE — Op Note (Signed)
[facility] [address] [place], [address]   COLONOSCOPY PROCEDURE REPORT  PATIENT: Linda Lane, Linda Lane  MR#: [id number] BIRTHDATE: 1940-08-03 , 70  yrs. old GENDER: Female ENDOSCOPIST: Rachael Fee, MD REFERRED HQ:[id number] Peggyann Juba, FNP PROCEDURE DATE:  03/01/2012 PROCEDURE:   Colonoscopy with snare polypectomy ASA CLASS:   Class III INDICATIONS:adenomatous polyp 2mm, removed 2004. MEDICATIONS: MAC sedation, administered by CRNA and Propofol (Diprivan) 200 mg IV  DESCRIPTION OF PROCEDURE:   After the risks benefits and alternatives of the procedure were thoroughly explained, informed consent was obtained.  A digital rectal exam revealed no abnormalities of the rectum.   The LB [id number]  endoscope was introduced through the anus and advanced to the cecum, which was identified by both the appendix and ileocecal valve. No adverse events experienced.   The quality of the prep was good, using MoviPrep  The instrument was then slowly withdrawn as the colon was fully examined.   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 There was a single small polyp that was removed and sent to pathology.  This was 2mm across, sigmoid colon, cold snare removed. The examination was otherwise normal.  Retroflexed views revealed no abnormalities. The time to cecum=4 minutes 27 seconds. Withdrawal time=8 minutes 14 seconds.  The scope was withdrawn and the procedure completed. COMPLICATIONS: There were no complications.  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 There was a single small polyp that was removed and sent to pathology. The examination was otherwise normal.  RECOMMENDATIONS: If the polyp(s) removed today are proven to be adenomatous (pre-cancerous) polyps, you will need a repeat colonoscopy in 5 years.  Otherwise you should continue to follow colorectal cancer screening guidelines for "routine  risk" patients with colonoscopy in 10 years.  You will receive a letter within 1-2 weeks with the results of your biopsy as well as final recommendations.  Please call my office if you have not received a letter after 3 weeks.   eSigned:  Rachael Fee, MD 03/01/2012 11:14 AM

## 2012-03-06 ENCOUNTER — Telehealth: Payer: Self-pay | Admitting: *Deleted

## 2012-03-06 NOTE — Telephone Encounter (Signed)
  Follow up Call-  Call back number 03/01/2012  Post procedure Call Back phone  # [phone]  Permission to leave phone message Yes     Patient questions:  Do you have a fever, pain , or abdominal swelling? no Pain Score  0 *  Have you tolerated food without any problems? yes  Have you been able to return to your normal activities? yes  Do you have any questions about your discharge instructions: Diet   no Medications  no Follow up visit  no  Do you have questions or concerns about your Care? no  Actions: * If pain score is 4 or above: No action needed, pain <4.

## 2012-03-10 ENCOUNTER — Encounter: Payer: Self-pay | Admitting: Gastroenterology

## 2012-03-13 ENCOUNTER — Encounter: Payer: Self-pay | Admitting: Internal Medicine

## 2012-03-27 ENCOUNTER — Encounter: Payer: Self-pay | Admitting: Cardiovascular Disease

## 2012-04-25 ENCOUNTER — Ambulatory Visit: Payer: Medicare HMO | Admitting: Cardiovascular Disease

## 2012-05-01 ENCOUNTER — Ambulatory Visit: Payer: Medicare HMO | Admitting: Family

## 2012-05-11 ENCOUNTER — Telehealth: Payer: Self-pay | Admitting: Family

## 2012-05-11 ENCOUNTER — Ambulatory Visit (HOSPITAL_BASED_OUTPATIENT_CLINIC_OR_DEPARTMENT_OTHER)
Admission: RE | Admit: 2012-05-11 | Discharge: 2012-05-11 | Disposition: A | Discharge status: Home / Self Care | Payer: Medicare HMO | Source: Ambulatory Visit | Attending: Family | Admitting: Family

## 2012-05-11 ENCOUNTER — Ambulatory Visit (INDEPENDENT_AMBULATORY_CARE_PROVIDER_SITE_OTHER): Payer: Medicare HMO | Admitting: Family

## 2012-05-11 ENCOUNTER — Encounter: Payer: Self-pay | Admitting: Family

## 2012-05-11 VITALS — BP 130/82 | HR 73 | Temp 97.7°F | Resp 16 | Ht 63.25 in | Wt 157.1 lb

## 2012-05-11 DIAGNOSIS — M549 Dorsalgia, unspecified: Secondary | ICD-10-CM | POA: Insufficient documentation

## 2012-05-11 DIAGNOSIS — S22000A Wedge compression fracture of unspecified thoracic vertebra, initial encounter for closed fracture: Secondary | ICD-10-CM | POA: Insufficient documentation

## 2012-05-11 MED ORDER — HYDROCODONE-ACETAMINOPHEN 5-325 MG PO TABS: 1.0000 | ORAL_TABLET | Freq: Four times a day (QID) | ORAL | Status: DC | PRN

## 2012-05-11 MED ORDER — CYCLOBENZAPRINE HCL 5 MG PO TABS: 5.0000 mg | ORAL_TABLET | Freq: Three times a day (TID) | ORAL | Status: DC | PRN

## 2012-05-11 NOTE — Assessment & Plan Note (Signed)
Pain level is concerning for compression fracture.  Obtain thoracic spine x ray. Add vicodin and flexeril prn.  She is instructed not to take together and not to drive after taking these medications.

## 2012-05-11 NOTE — Telephone Encounter (Signed)
Reviewed x ray of thoracic spine.  Notes T8 and T11 compression fractures.  Will plan referral to NS for possible KP- left detailed message on pt's machine.

## 2012-05-11 NOTE — Patient Instructions (Addendum)
Please complete your x ray prior to leaving.  Call if symptoms worsen or if no improvement in 1 week.  Follow up in 1 month for wellness visit.

## 2012-05-11 NOTE — Progress Notes (Signed)
Subjective:    Patient ID: Linda Lane, female    DOB: Aug 02, 1940, 72 y.o.   MRN: [id number]  HPI  Linda Lane is a 72 yr old female who presents today with chief complaint of mid back pain.  She reports that she pulled up the laminate floor in her home on Saturday.  She woke up on Sunday "barely able to walk." Symptoms have continued to worsen. She tried tylenol without improvement.  Tried percocet last night.  This helped her to sleep.  Review of Systems See HPI  Past Medical History  Diagnosis Date  . HTN (hypertension)   . Meningitis     with right ear complete deafness  . History of gastroesophageal reflux (GERD)   . Peptic ulcer disease   . CAD (coronary artery disease)   . SOB (shortness of breath)   . Hyperlipidemia   . History of colon polyps   . Cancer 2005    history of melanoma    History   Social History  . Marital Status: Divorced    Spouse Name: N/A    Number of Children: N/A  . Years of Education: N/A   Occupational History  . Not on file.   Social History Main Topics  . Smoking status: Current Every Day Smoker -- 0.5 packs/day    Types: Cigarettes  . Smokeless tobacco: Former Neurosurgeon  . Alcohol Use: 1.0 - 1.5 oz/week    2-3 drink(s) per week  . Drug Use: No  . Sexually Active: Not on file   Other Topics Concern  . Not on file   Social History Narrative  . No narrative on file    Past Surgical History  Procedure Date  . Middle ear surgery   . Abdominal hysterectomy   . Cholecystectomy   . Appendectomy   . Bilateral breast implants   . Laparoscopic gastric banding   . Gastric bypass   . Tonsillectomy 1962  . Pacemaker placement 1999, 2007, 2010  . Melanoma excision 2005    beneath chin    Family History  Problem Relation Age of Onset  . Heart attack Mother   . Stroke Mother   . Ulcers Father   . Stroke Father   . Hypertension Brother   . Hypertension Sister   . Heart disease Maternal Grandmother   . Heart disease Maternal  Grandfather   . Colon cancer Neg Hx     No Known Allergies  Current Outpatient Prescriptions on File Prior to Visit  Medication Sig Dispense Refill  . amLODipine (NORVASC) 5 MG tablet Take 5 mg by mouth daily.       Marland Kitchen aspirin 81 MG tablet Take 81 mg by mouth daily.      Marland Kitchen atenolol (TENORMIN) 25 MG tablet Take 1 tablet (25 mg total) by mouth daily.  90 tablet  3  . atorvastatin (LIPITOR) 10 MG tablet Take 1 tablet (10 mg total) by mouth daily.  90 tablet  3  . lansoprazole (PREVACID) 30 MG capsule Take 1 capsule (30 mg total) by mouth 2 (two) times daily.  60 capsule  1  . lisinopril (PRINIVIL,ZESTRIL) 20 MG tablet Take 1 tablet (20 mg total) by mouth daily.  90 tablet  3    BP 130/82  Pulse 73  Temp 97.7 F (36.5 C) (Oral)  Resp 16  Ht 5' 3.25" (1.607 m)  Wt 157 lb 1.9 oz (71.269 kg)  BMI 27.61 kg/m2  SpO2 99%  LMP 04/05/1988  Objective:   Physical Exam  Constitutional: She appears well-developed and well-nourished. No distress.  Cardiovascular: Normal rate and regular rhythm.   No murmur heard. Pulmonary/Chest: Effort normal and breath sounds normal. No respiratory distress. She has no wheezes. She has no rales. She exhibits no tenderness.  Musculoskeletal: She exhibits no edema.       Thoracic back: She exhibits no tenderness.       Lumbar back: She exhibits no tenderness.          Assessment & Plan:

## 2012-05-15 ENCOUNTER — Telehealth: Payer: Self-pay | Admitting: *Deleted

## 2012-05-15 ENCOUNTER — Telehealth: Payer: Self-pay | Admitting: Family

## 2012-05-15 MED ORDER — OXYCODONE-ACETAMINOPHEN 5-325 MG PO TABS: 1.0000 | ORAL_TABLET | Freq: Three times a day (TID) | ORAL | Status: DC | PRN

## 2012-05-15 NOTE — Telephone Encounter (Signed)
Notified pt. She asks if we can take Rx to [facility] as she doesn't have anyone that can pick up Rx. She will try to get medication herself if she can't arrange for someone to get it for her. Rx printed and forwarded to Provider for signature.

## 2012-05-15 NOTE — Telephone Encounter (Signed)
Message copied by Sandford Craze on Mon May 15, 2012  4:44 PM ------      Message from: Darral Dash E      Created: Mon May 15, 2012  2:19 PM          I will  , this was fax to [facility] , they do not take her insurance, sending request to  [facility],their do review before scheduling             ----- Message -----         From: Sandford Craze, NP         Sent: 05/15/2012   1:15 PM           To: Keith Rake Aheron            Pt is in a lot of pain. Could you pls check status of neurosurg referral and see if they can see her as soon as possible.       ------

## 2012-05-15 NOTE — Telephone Encounter (Signed)
Received call from pt stating her back pain is getting worse. Hydrocodone does not seem to be helping. Has not been contacted by neurosurgery yet. States that her daughter works at [employer] [facility] [employer] [employer] and would like Rx called to [employer]. Please advise.

## 2012-05-15 NOTE — Telephone Encounter (Signed)
Rx signed and taken down to [facility].

## 2012-05-15 NOTE — Telephone Encounter (Signed)
OK to send percocet 5/325 one tab every 6 hours as needed.  #30 no refills. Will check status of neurosurg apt.

## 2012-05-16 ENCOUNTER — Telehealth: Payer: Self-pay | Admitting: Family

## 2012-05-16 DIAGNOSIS — IMO0002 Reserved for concepts with insufficient information to code with codable children: Secondary | ICD-10-CM

## 2012-05-16 DIAGNOSIS — I10 Essential (primary) hypertension: Secondary | ICD-10-CM

## 2012-05-16 NOTE — Telephone Encounter (Signed)
Pt will need bmet first please.  Order placed.

## 2012-05-16 NOTE — Telephone Encounter (Signed)
[facility], she has pacemaker and can not have MRI, [facility] wants [facility] to order CT  Thoracic  W/Wo  Will try to get this today, Patient is scheduled to Dr Flo Shanks Friday Feb 14

## 2012-05-16 NOTE — Telephone Encounter (Signed)
Notified [facility], she will try to arrange with [facility] as they can do stat lab and testing on same day.

## 2012-05-16 NOTE — Telephone Encounter (Signed)
Patient referred to [facility],   Dr  Flo Shanks is request that we order MRI, Thoracic w/wo  He will see pt .

## 2012-05-30 ENCOUNTER — Telehealth: Payer: Self-pay | Admitting: Family

## 2012-05-30 DIAGNOSIS — M810 Age-related osteoporosis without current pathological fracture: Secondary | ICD-10-CM

## 2012-05-30 NOTE — Telephone Encounter (Signed)
Please call pt and schedule bone density.

## 2012-06-06 ENCOUNTER — Ambulatory Visit (INDEPENDENT_AMBULATORY_CARE_PROVIDER_SITE_OTHER)
Admission: RE | Admit: 2012-06-06 | Discharge: 2012-06-06 | Disposition: A | Discharge status: Home / Self Care | Payer: Medicare HMO | Source: Ambulatory Visit | Attending: Family | Admitting: Family

## 2012-06-21 ENCOUNTER — Telehealth: Payer: Self-pay | Admitting: *Deleted

## 2012-06-21 NOTE — Telephone Encounter (Signed)
Received message from [place] at [facility] stating that she had ordered a DEXA and lumbar and thoracic studies to be completed on pt before she returned to her for follow up today. She believes that pt was contacted by someone from our office and had tests rescheduled. She is upset as the tests that she ordered were not completed (wrong tests ordered?) and she has no results for the pt?. I see no record of communication from [place] to pt or between [facility] and [place] re: imaging studies since February when we referred pt to them. We did order a DEXA on pt from the primary care aspect and can forward results to [facility]. Attempted to contact Larita Fife but reached recording that office is closed. Will try again tomorrow.

## 2012-06-22 NOTE — Telephone Encounter (Signed)
Spoke with Larita Fife, Dr Merril Abbe PA at [facility]. Advised her that we ordered DEXA from primary standpoint but were not involved in any further imaging studies pt may have had ordered or cancelled. DEXA results faxed to [phone].

## 2012-06-27 ENCOUNTER — Ambulatory Visit (INDEPENDENT_AMBULATORY_CARE_PROVIDER_SITE_OTHER): Payer: Medicare HMO | Admitting: Cardiovascular Disease

## 2012-06-27 ENCOUNTER — Encounter: Payer: Self-pay | Admitting: Cardiovascular Disease

## 2012-06-27 VITALS — BP 116/72 | HR 69 | Ht 64.0 in | Wt 154.8 lb

## 2012-06-27 DIAGNOSIS — I10 Essential (primary) hypertension: Secondary | ICD-10-CM

## 2012-06-27 NOTE — Patient Instructions (Addendum)
Your physician wants you to follow-up in: 1 year with Dr. Cooper.  You will receive a reminder letter in the mail two months in advance. If you don't receive a letter, please call our office to schedule the follow-up appointment.  

## 2012-06-27 NOTE — [facility] Notes (Signed)
HPI:  72 year old woman presenting for followup evaluation. The patient has a history of nonobstructive coronary artery disease. She has hypertension and diastolic dysfunction. She also has mild carotid stenosis and remote history of atrial fibrillation with permanent pacemaker placement in 2009 initially, followed by redo pacemaker placement in 2010.  Labs from 2013 showed a creatinine of 0.8, normal LFTs, cholesterol 150, triglycerides 91, HDL 42, and LDL 89.  The patient has done well from a cardiac perspective. She denies chest pain, chest pressure, dyspnea, edema, or palpitations.  Outpatient Encounter Prescriptions as of 06/27/2012  Medication Sig Dispense Refill  . amLODipine (NORVASC) 5 MG tablet Take 5 mg by mouth daily.       Marland Kitchen aspirin 81 MG tablet Take 81 mg by mouth daily.      Marland Kitchen atenolol (TENORMIN) 25 MG tablet Take 1 tablet (25 mg total) by mouth daily.  90 tablet  3  . atorvastatin (LIPITOR) 10 MG tablet Take 1 tablet (10 mg total) by mouth daily.  90 tablet  3  . lansoprazole (PREVACID) 30 MG capsule Take 1 capsule (30 mg total) by mouth 2 (two) times daily.  60 capsule  1  . lisinopril (PRINIVIL,ZESTRIL) 20 MG tablet Take 1 tablet (20 mg total) by mouth daily.  90 tablet  3  . oxyCODONE-acetaminophen (PERCOCET) 7.5-325 MG per tablet Take 1 tablet by mouth every 6 (six) hours as needed for pain.      . [DISCONTINUED] cyclobenzaprine (FLEXERIL) 5 MG tablet Take 1 tablet (5 mg total) by mouth 3 (three) times daily as needed for muscle spasms.  20 tablet  0  . [DISCONTINUED] HYDROcodone-acetaminophen (NORCO/VICODIN) 5-325 MG per tablet Take 1 tablet by mouth every 6 (six) hours as needed for pain.  20 tablet  0  . [DISCONTINUED] oxyCODONE-acetaminophen (ROXICET) 5-325 MG per tablet Take 1 tablet by mouth every 8 (eight) hours as needed for pain.  30 tablet  0   No facility-administered encounter medications on file as of 06/27/2012.    No Known Allergies  Past Medical History    Diagnosis Date  . HTN (hypertension)   . Meningitis     with right ear complete deafness  . History of gastroesophageal reflux (GERD)   . Peptic ulcer disease   . CAD (coronary artery disease)   . SOB (shortness of breath)   . Hyperlipidemia   . History of colon polyps   . Cancer 2005    history of melanoma    ROS: Negative except as per HPI  BP 116/72  Pulse 69  Ht 5\' 4"  (1.626 m)  Wt 70.217 kg (154 lb 12.8 oz)  BMI 26.56 kg/m2  LMP 04/05/1988  PHYSICAL EXAM: Pt is alert and oriented, NAD HEENT: normal Neck: JVP - normal, carotids 2+= without bruits Lungs: CTA bilaterally CV: RRR without murmur or gallop Abd: soft, NT, Positive BS, no hepatomegaly Ext: no C/C/E, distal pulses intact and equal Skin: warm/dry no rash  EKG:  Electronic atrial pacemaker, possible age indeterminate inferior infarction.  ASSESSMENT AND PLAN: 1. Coronary artery disease, native vessel. The patient remains stable without anginal symptoms. She will remain on aspirin 81 mg, beta blocker, and statin drug.  2. Essential hypertension. Her blood pressure is well controlled on a combination of amlodipine, atenolol, and lisinopril.  3. Hypercholesterolemia. Last lipids revealed cholesterol 150, HDL 42, LDL 89, and triglycerides 91.  For followup I will see her back in one year.  Tonny Bollman 06/30/2012 9:58 PM

## 2012-08-15 ENCOUNTER — Ambulatory Visit: Payer: Medicare HMO | Admitting: Family

## 2012-08-15 ENCOUNTER — Ambulatory Visit (INDEPENDENT_AMBULATORY_CARE_PROVIDER_SITE_OTHER): Payer: Medicare HMO | Admitting: Cardiovascular Disease

## 2012-08-15 ENCOUNTER — Telehealth: Payer: Self-pay

## 2012-08-15 ENCOUNTER — Encounter: Payer: Self-pay | Admitting: Cardiovascular Disease

## 2012-08-15 ENCOUNTER — Ambulatory Visit (INDEPENDENT_AMBULATORY_CARE_PROVIDER_SITE_OTHER)
Admission: RE | Admit: 2012-08-15 | Discharge: 2012-08-15 | Disposition: A | Discharge status: Home / Self Care | Payer: Medicare HMO | Source: Ambulatory Visit | Attending: Cardiovascular Disease | Admitting: Cardiovascular Disease

## 2012-08-15 VITALS — BP 90/72 | HR 80 | Ht 64.0 in | Wt 145.0 lb

## 2012-08-15 DIAGNOSIS — R0602 Shortness of breath: Secondary | ICD-10-CM

## 2012-08-15 LAB — CBC WITH DIFFERENTIAL/PLATELET
Basophils Absolute: 0.1 10*3/uL (ref 0.0–0.1)
Eosinophils Absolute: 0.4 10*3/uL (ref 0.0–0.7)
Eosinophils Relative: 4.7 % (ref 0.0–5.0)
HCT: 38.7 % (ref 36.0–46.0)
Lymphs Abs: 2.5 10*3/uL (ref 0.7–4.0)
MCV: 78.9 fl (ref 78.0–100.0)
Monocytes Absolute: 0.8 10*3/uL (ref 0.1–1.0)
Neutrophils Relative %: 55.6 % (ref 43.0–77.0)
Platelets: 258 10*3/uL (ref 150.0–400.0)
RDW: 17.6 % — ABNORMAL HIGH (ref 11.5–14.6)
WBC: 8.6 10*3/uL (ref 4.5–10.5)

## 2012-08-15 LAB — HEPATIC FUNCTION PANEL
Alkaline Phosphatase: 115 U/L (ref 39–117)
Bilirubin, Direct: 0 mg/dL (ref 0.0–0.3)
Total Bilirubin: 0.3 mg/dL (ref 0.3–1.2)

## 2012-08-15 LAB — BASIC METABOLIC PANEL
Calcium: 9 mg/dL (ref 8.4–10.5)
GFR: 66.56 mL/min (ref >60.00)
Potassium: 4.3 mEq/L (ref 3.5–5.1)
Sodium: 139 mEq/L (ref 135–145)

## 2012-08-15 NOTE — Patient Instructions (Signed)
A chest x-ray takes a picture of the organs and structures inside the chest, including the heart, lungs, and blood vessels. This test can show several things, including, whether the heart is enlarges; whether fluid is building up in the lungs; and whether pacemaker / defibrillator leads are still in place. Please have this performed at the [place] office across from [facility].   Your physician recommends that you have lab work today: CBC, BMP, LIVER and BNP  Your physician has recommended you make the following change in your medication: HOLD LISINOPRIL

## 2012-08-15 NOTE — Telephone Encounter (Signed)
Message copied by Donata Duff on Tue Aug 15, 2012  1:16 PM ------      Message from: Rachael Fee      Created: Tue Aug 15, 2012  1:11 PM       Kathlene November, thanks for the note.            Devina Bezold,       She needs rov, next available me or extender for new dysphagia, weight loss.      Thanks            dj            ----- Message -----         From: Tonny Bollman, MD         Sent: 08/15/2012  12:03 PM           To: Rachael Fee, MD            Janine Limbo - please see attached note. Sounds like she may need EGD. thx Kathlene November       ------

## 2012-08-15 NOTE — [facility] Notes (Signed)
HPI:  72 year old woman presenting for followup evaluation. The patient's daughter-in-law, who I work within the cath lab, notified us yesterday that the patient has developed shortness of breath and orthopnea. We brought her in today for evaluation. She complains of 3 pillow orthopnea for the past few weeks. This is a new problem. She denies PND. She has chronic leg swelling, unchanged over time. She's developed lightheadedness and presyncope. She has not had frank syncope. She has had a pain around the right side of her chest under the right breast. She denies exertional chest pain. She complains of recent onset of solid and liquid dysphasia. She feels like food and liquids are getting stuck after eating. She has lost 10 pounds since I saw her 2 months ago. She denies fevers, chills, night sweats, or cough. She continues to smoke.  Outpatient Encounter Prescriptions as of 08/15/2012  Medication Sig Dispense Refill  . amLODipine (NORVASC) 5 MG tablet Take 5 mg by mouth daily.       Marland Kitchen aspirin 81 MG tablet Take 81 mg by mouth daily.      Marland Kitchen atenolol (TENORMIN) 25 MG tablet Take 1 tablet (25 mg total) by mouth daily.  90 tablet  3  . atorvastatin (LIPITOR) 10 MG tablet Take 1 tablet (10 mg total) by mouth daily.  90 tablet  3  . lansoprazole (PREVACID) 30 MG capsule Take 1 capsule (30 mg total) by mouth 2 (two) times daily.  60 capsule  1  . lisinopril (PRINIVIL,ZESTRIL) 20 MG tablet ON HOLD starting 08/15/12  90 tablet  3  . OXYCODONE-ACETAMINOPHEN PO Take 15 mg  tablet by mouth every 6 hours as needed for pain      . [DISCONTINUED] lisinopril (PRINIVIL,ZESTRIL) 20 MG tablet Take 1 tablet (20 mg total) by mouth daily.  90 tablet  3  . [DISCONTINUED] oxyCODONE-acetaminophen (PERCOCET) 7.5-325 MG per tablet Take 1 tablet by mouth every 6 (six) hours as needed for pain.       No facility-administered encounter medications on file as of 08/15/2012.    No Known Allergies  Past Medical History    Diagnosis Date  . HTN (hypertension)   . Meningitis     with right ear complete deafness  . History of gastroesophageal reflux (GERD)   . Peptic ulcer disease   . CAD (coronary artery disease)   . SOB (shortness of breath)   . Hyperlipidemia   . History of colon polyps   . Cancer 2005    history of melanoma   ROS: Negative except as per HPI  BP 90/72  Pulse 80  Ht 5\' 4"  (1.626 m)  Wt 65.772 kg (145 lb)  BMI 24.88 kg/m2  SpO2 91%  LMP 04/05/1988  PHYSICAL EXAM: Pt is alert and oriented, NAD HEENT: normal Neck: JVP - normal, carotids 2+= without bruits Lungs: CTA bilaterally CV: RRR without murmur or gallop Abd: soft, NT, Positive BS, no hepatomegaly Ext: no C/C/E, distal pulses intact and equal Skin: warm/dry no rash  EKG:    ASSESSMENT AND PLAN: 1. Orthopnea. Unclear etiology. Her lung fields are clear on exam today. Her neck veins are flat. I think if anything she is volume depleted. Will check a chest x-ray, BNP, and metabolic panel. I don't think we should give her diuretics at this point. Her left ventricular function has been normal in the past.  2. Coronary artery disease, nonobstructive. No anginal symptoms. Her chest pain is atypical. No further workup needed at this time.  3. Hypotension. I repeated the patient's blood pressure and confirmed that it is 90/60 in the left arm and 98/62 in the right arm. I've asked her to hold lisinopril. She will continue on atenolol and amlodipine. I suspect this is related to weight loss and volume depletion.  4. Solid and liquid dysphasia. She has undergone colonoscopy by Dr. Christella Hartigan. Will refer back to him.  Tonny Bollman 08/15/2012 9:44 AM

## 2012-08-16 NOTE — Telephone Encounter (Signed)
Left message on machine to call back  

## 2012-08-17 NOTE — Telephone Encounter (Signed)
Pt has been scheduled with Dr Christella Hartigan for 09/11/12 pt will call back if this is not convenient for her daughter in law

## 2012-09-11 ENCOUNTER — Telehealth: Payer: Self-pay | Admitting: *Deleted

## 2012-09-11 ENCOUNTER — Encounter: Payer: Self-pay | Admitting: Gastroenterology

## 2012-09-11 ENCOUNTER — Ambulatory Visit: Payer: Medicare HMO | Admitting: Gastroenterology

## 2012-09-11 ENCOUNTER — Ambulatory Visit (INDEPENDENT_AMBULATORY_CARE_PROVIDER_SITE_OTHER): Payer: Medicare HMO | Admitting: Gastroenterology

## 2012-09-11 VITALS — BP 106/70 | HR 76 | Ht 61.75 in | Wt 146.5 lb

## 2012-09-11 DIAGNOSIS — R1314 Dysphagia, pharyngoesophageal phase: Secondary | ICD-10-CM

## 2012-09-11 MED ORDER — OXYCODONE HCL 15 MG PO TABS: 15.0000 mg | ORAL_TABLET | Freq: Four times a day (QID) | ORAL | Status: DC | PRN

## 2012-09-11 NOTE — Progress Notes (Signed)
Review of pertinent gastrointestinal problems: 1. Small adenomatous polyp in 2008 on colonoscopy.  Repeat colonoscopy 02/2012 found no adenomatous polyps, +left sided diverticulosis; recall colonoscopy at 10 year interval.   HPI: This is a    very pleasant 71-year-old woman whom I last saw about a year ago. She is here with her daughter-in-law to discuss a new problem today.  Trouble swallowing, she says she has no real problem with this but her pro  Drinking water or eating solids causes dysphagia.  CAuses pain as well.  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  Complicated by bleeding at some point.  Unclear surgeries, but her eventual gastric bypass was done in [place].   Swallowing issues for about 1 month ago.    Has lost at least 11 pounds, can tell she has lost weight recently. (acutally 25 pounds in 10 months per patient).  No pyrosis on BID PPI.  One parent had ulcers.  Takes no NSAIDs.  Review of systems: Pertinent positive and negative review of systems were noted in the above HPI section. Complete review of systems was performed and was otherwise normal.    Past Medical History  Diagnosis Date  . HTN (hypertension)   . Meningitis     with right ear complete deafness  . History of gastroesophageal reflux (GERD)   . Peptic ulcer disease   . CAD (coronary artery disease)   . SOB (shortness of breath)   . Hyperlipidemia   . History of colon polyps   . Cancer 2005    history of melanoma    Past Surgical History  Procedure Laterality Date  . Middle ear surgery Bilateral   . Abdominal hysterectomy    . Cholecystectomy    . Appendectomy    . Bilateral breast implants    . Laparoscopic gastric banding    . Gastric bypass    . Tonsillectomy  1962  . Pacemaker placement  1999, 2007, 2010  . Melanoma excision  2005    beneath chin    Current Outpatient Prescriptions  Medication Sig  Dispense Refill  . amLODipine (NORVASC) 5 MG tablet Take 5 mg by mouth daily.       . aspirin 81 MG tablet Take 81 mg by mouth daily.      . atenolol (TENORMIN) 25 MG tablet Take 1 tablet (25 mg total) by mouth daily.  90 tablet  3  . atorvastatin (LIPITOR) 10 MG tablet Take 1 tablet (10 mg total) by mouth daily.  90 tablet  3  . lansoprazole (PREVACID) 30 MG capsule Take 1 capsule (30 mg total) by mouth 2 (two) times daily.  60 capsule  1  . oxyCODONE (ROXICODONE) 15 MG immediate release tablet Take 1 tablet (15 mg total) by mouth every 6 (six) hours as needed for pain.  90 tablet  0  . lisinopril (PRINIVIL,ZESTRIL) 20 MG tablet ON HOLD starting 08/15/12  90 tablet  3   No current facility-administered medications for this visit.    Allergies as of 09/11/2012  . (No Known Allergies)    Family History  Problem Relation Age of Onset  . Heart attack Mother   . Stroke Mother   . Ulcers Father   . Stroke Father   . Hypertension Brother   . Hypertension Sister   . Heart disease Maternal Grandmother   . Heart disease Maternal Grandfather   . Colon cancer Neg Hx       History   Social History  . Marital Status: Divorced    Spouse Name: N/A    Number of Children: 2  . Years of Education: N/A   Occupational History  . Not on file.   Social History Main Topics  . Smoking status: Current Every Day Smoker -- 0.50 packs/day    Types: Cigarettes  . Smokeless tobacco: Never Used  . Alcohol Use: 1 - 1.5 oz/week    2-3 drink(s) per week     Comment: social  . Drug Use: No  . Sexually Active: Not on file   Other Topics Concern  . Not on file   Social History Narrative  . No narrative on file       Physical Exam: BP 106/70  Pulse 76  Ht 5' 1.75" (1.568 m)  Wt 146 lb 8 oz (66.452 kg)  BMI 27.03 kg/m2  LMP 04/05/1988 Constitutional: generally well-appearing Psychiatric: alert and oriented x3 Eyes: extraocular movements intact Mouth: oral pharynx moist, no lesions Neck:  supple no lymphadenopathy Cardiovascular: heart regular rate and rhythm Lungs: clear to auscultation bilaterally Abdomen: soft, nontender, nondistended, no obvious ascites, no peritoneal signs, normal bowel sounds Extremities: no lower extremity edema bilaterally Skin: no lesions on visible extremities  no lower extremity pitting  edema   Assessment and plan: 71 y.o. female with  weight loss, dysphagia, early satiety  She has had several gastric surgeries. Many of these were done in [place]. 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 She had been doing fine for a long time but in the past month to 3 or 4 months she has  had dysphagia, early satiety, unintentional weight loss.  Perhaps she has an anastomotic stricture, peptic ulcer disease, neoplasm. I will like to proceed with EGD at her soonest convenience. She was in a cardiology office with some shortness of breath when laying down however cardiac wise she seems to be very stable. Today her lungs are very clear and she has no pitting edema. A month ago her BNP was completely normal.     

## 2012-09-11 NOTE — Telephone Encounter (Signed)
Received message from pt stating she was getting oxycodone 15mg  from Dr Flo Shanks and was told to see [place] for further refills. Pt is requesting refill.  Please advise.

## 2012-09-11 NOTE — Telephone Encounter (Signed)
Left detailed message on home # that rx is at front desk for pick up and she should call the office to arrange follow up as indicated below and to call if any questions.

## 2012-09-11 NOTE — Telephone Encounter (Signed)
Rx printed.  I would like to see her back in the office for follow up.  i can manage her pain meds for short term but ultimately she will need to establish with pain management.

## 2012-09-11 NOTE — Patient Instructions (Addendum)
One of your biggest health concerns is your smoking.  This increases your risk for most cancers and serious cardiovascular diseases such as strokes, heart attacks.  You should try your best to stop.  If you need assistance, please contact your PCP or Smoking Cessation Class at [facility] [phone]) or [facility] Quit-Line (1-800-QUIT-NOW). You will be set up for an upper endoscopy for dysphagia thisThursday at [facility] with MAC sedation.  You have been scheduled for an endoscopy with propofol. Please follow written instructions given to you at your visit today. If you use inhalers (even only as needed), please bring them with you on the day of your procedure. Your physician has requested that you go to www.startemmi.com and enter the access code given to you at your visit today. This web site gives a general overview about your procedure. However, you should still follow specific instructions given to you by our office regarding your preparation for the procedure.                                               We are excited to introduce MyChart, a new best-in-class service that provides you online access to important information in your electronic medical record. We want to make it easier for you to view your health information - all in one secure location - when and where you need it. We expect MyChart will enhance the quality of care and service we provide.  When you register for MyChart, you can:    View your test results.    Request appointments and receive appointment reminders via email.    Request medication renewals.    View your medical history, allergies, medications and immunizations.    Communicate with your physician's office through a password-protected site.    Conveniently print information such as your medication lists.  To find out if MyChart is right for you, please talk to a member of our clinical staff today. We will gladly answer your questions about this free health  and wellness tool.  If you are age 76 or older and want a member of your family to have access to your record, you must provide written consent by completing a proxy form available at our office. Please speak to our clinical staff about guidelines regarding accounts for patients younger than age 2.  As you activate your MyChart account and need any technical assistance, please call the MyChart technical support line at [phone] 83-CHART [phone]) or email your question to [email or web address]. If you email your question(s), please include your name, a return phone number and the best time to reach you.  If you have non-urgent health-related questions, you can send a message to our office through MyChart at [email or web address]. If you have a medical emergency, call 911.  Thank you for using MyChart as your new health and wellness resource!   MyChart licensed from [employer],  [id number]. Patents Pending.

## 2012-09-12 ENCOUNTER — Encounter (HOSPITAL_COMMUNITY): Payer: Self-pay | Admitting: Pharmacy Technician

## 2012-09-12 ENCOUNTER — Telehealth: Payer: Self-pay | Admitting: Gastroenterology

## 2012-09-12 ENCOUNTER — Encounter (HOSPITAL_COMMUNITY): Payer: Self-pay | Admitting: *Deleted

## 2012-09-12 NOTE — Telephone Encounter (Signed)
The daughter n law will be able to bring the pt to the appt Thursday as scheduled she will bring the pt and and pick her up when done

## 2012-09-12 NOTE — Telephone Encounter (Signed)
Spoke with pt, she will be able to pick up Rx on Friday as she does not currently drive and will not have a driver until then.  Scheduled f/u for 09/26/12 at 2:15pm.

## 2012-09-12 NOTE — Progress Notes (Addendum)
.   Perioperative packemaker oders receicved from dr taylor and placed on pt chart. Spoke with dr Acey Lav by phone made  aware pacemaker orders say asychronous pacing during procedure and return to normal programming after procedure. Dr Acey Lav states medtronic rep not needed for endo procedure 09-14-2012

## 2012-09-14 ENCOUNTER — Telehealth: Payer: Self-pay

## 2012-09-14 ENCOUNTER — Encounter (HOSPITAL_COMMUNITY): Payer: Self-pay

## 2012-09-14 ENCOUNTER — Ambulatory Visit (HOSPITAL_COMMUNITY)
Admission: RE | Admit: 2012-09-14 | Discharge: 2012-09-14 | Disposition: A | Discharge status: Home / Self Care | Payer: Medicare HMO | Source: Ambulatory Visit | Attending: Gastroenterology | Admitting: Gastroenterology

## 2012-09-14 ENCOUNTER — Ambulatory Visit (HOSPITAL_COMMUNITY): Payer: Medicare HMO | Admitting: Anesthesiology

## 2012-09-14 ENCOUNTER — Encounter (HOSPITAL_COMMUNITY)
Admission: RE | Disposition: A | Discharge status: Home / Self Care | Payer: Self-pay | Source: Ambulatory Visit | Attending: Gastroenterology

## 2012-09-14 ENCOUNTER — Encounter (HOSPITAL_COMMUNITY): Payer: Self-pay | Admitting: Anesthesiology

## 2012-09-14 DIAGNOSIS — Z950 Presence of cardiac pacemaker: Secondary | ICD-10-CM | POA: Insufficient documentation

## 2012-09-14 DIAGNOSIS — R1314 Dysphagia, pharyngoesophageal phase: Secondary | ICD-10-CM

## 2012-09-14 DIAGNOSIS — Z7982 Long term (current) use of aspirin: Secondary | ICD-10-CM | POA: Insufficient documentation

## 2012-09-14 DIAGNOSIS — K2990 Gastroduodenitis, unspecified, without bleeding: Secondary | ICD-10-CM

## 2012-09-14 DIAGNOSIS — K219 Gastro-esophageal reflux disease without esophagitis: Secondary | ICD-10-CM | POA: Insufficient documentation

## 2012-09-14 DIAGNOSIS — K2970 Gastritis, unspecified, without bleeding: Secondary | ICD-10-CM | POA: Insufficient documentation

## 2012-09-14 DIAGNOSIS — Z860100 Personal history of colon polyps, unspecified: Secondary | ICD-10-CM | POA: Insufficient documentation

## 2012-09-14 DIAGNOSIS — Z85820 Personal history of malignant melanoma of skin: Secondary | ICD-10-CM | POA: Insufficient documentation

## 2012-09-14 DIAGNOSIS — E785 Hyperlipidemia, unspecified: Secondary | ICD-10-CM | POA: Insufficient documentation

## 2012-09-14 DIAGNOSIS — R6881 Early satiety: Secondary | ICD-10-CM | POA: Insufficient documentation

## 2012-09-14 DIAGNOSIS — K2960 Other gastritis without bleeding: Secondary | ICD-10-CM | POA: Insufficient documentation

## 2012-09-14 DIAGNOSIS — Z8711 Personal history of peptic ulcer disease: Secondary | ICD-10-CM | POA: Insufficient documentation

## 2012-09-14 DIAGNOSIS — Z79899 Other long term (current) drug therapy: Secondary | ICD-10-CM | POA: Insufficient documentation

## 2012-09-14 DIAGNOSIS — I2510 Atherosclerotic heart disease of native coronary artery without angina pectoris: Secondary | ICD-10-CM | POA: Insufficient documentation

## 2012-09-14 DIAGNOSIS — Z9089 Acquired absence of other organs: Secondary | ICD-10-CM | POA: Insufficient documentation

## 2012-09-14 DIAGNOSIS — K319 Disease of stomach and duodenum, unspecified: Secondary | ICD-10-CM | POA: Insufficient documentation

## 2012-09-14 DIAGNOSIS — R634 Abnormal weight loss: Secondary | ICD-10-CM | POA: Insufficient documentation

## 2012-09-14 DIAGNOSIS — K222 Esophageal obstruction: Secondary | ICD-10-CM | POA: Insufficient documentation

## 2012-09-14 DIAGNOSIS — Z9884 Bariatric surgery status: Secondary | ICD-10-CM | POA: Insufficient documentation

## 2012-09-14 DIAGNOSIS — I10 Essential (primary) hypertension: Secondary | ICD-10-CM | POA: Insufficient documentation

## 2012-09-14 DIAGNOSIS — R1310 Dysphagia, unspecified: Secondary | ICD-10-CM | POA: Insufficient documentation

## 2012-09-14 DIAGNOSIS — K316 Fistula of stomach and duodenum: Secondary | ICD-10-CM

## 2012-09-14 HISTORY — DX: Effusion, unspecified ankle: M25.473

## 2012-09-14 HISTORY — PX: BALLOON DILATION: SHX5330

## 2012-09-14 HISTORY — DX: Adverse effect of unspecified anesthetic, initial encounter: T41.45XA

## 2012-09-14 HISTORY — DX: Other complications of anesthesia, initial encounter: T88.59XA

## 2012-09-14 HISTORY — PX: ESOPHAGOGASTRODUODENOSCOPY: SHX5428

## 2012-09-14 HISTORY — DX: Age-related osteoporosis without current pathological fracture: M81.0

## 2012-09-14 SURGERY — EGD (ESOPHAGOGASTRODUODENOSCOPY)
Anesthesia: General

## 2012-09-14 MED ORDER — KETAMINE HCL 10 MG/ML IJ SOLN
INTRAMUSCULAR | Status: DC | PRN
  Administered 2012-09-14 (×2): 10 mg via INTRAVENOUS

## 2012-09-14 MED ORDER — PROPOFOL INFUSION 10 MG/ML OPTIME
INTRAVENOUS | Status: DC | PRN
  Administered 2012-09-14: 75 ug/kg/min via INTRAVENOUS

## 2012-09-14 MED ORDER — ONDANSETRON HCL 4 MG/2ML IJ SOLN
INTRAMUSCULAR | Status: DC | PRN
  Administered 2012-09-14 (×2): 2 mg via INTRAVENOUS

## 2012-09-14 MED ORDER — MIDAZOLAM HCL 5 MG/5ML IJ SOLN
INTRAMUSCULAR | Status: DC | PRN
  Administered 2012-09-14: 1 mg via INTRAVENOUS

## 2012-09-14 MED ORDER — LACTATED RINGERS IV SOLN
INTRAVENOUS | Status: DC | PRN
  Administered 2012-09-14: 08:00:00 via INTRAVENOUS

## 2012-09-14 MED ORDER — FENTANYL CITRATE 0.05 MG/ML IJ SOLN
INTRAMUSCULAR | Status: DC | PRN
  Administered 2012-09-14: 50 ug via INTRAVENOUS

## 2012-09-14 MED ORDER — SODIUM CHLORIDE 0.9 % IV SOLN: INTRAVENOUS | Status: DC

## 2012-09-14 NOTE — Interval H&P Note (Signed)
History and Physical Interval Note:  09/14/2012 8:32 AM  Linda Lane  has presented today for surgery, with the diagnosis of Dysphagia [787.20]  The various methods of treatment have been discussed with the patient and family. After consideration of risks, benefits and other options for treatment, the patient has consented to  Procedure(s): ESOPHAGOGASTRODUODENOSCOPY (EGD) (N/A) BALLOON DILATION (N/A) as a surgical intervention .  The patient's history has been reviewed, patient examined, no change in status, stable for surgery.  I have reviewed the patient's chart and labs.  Questions were answered to the patient's satisfaction.     Rob Bunting

## 2012-09-14 NOTE — Telephone Encounter (Signed)
Pt was notified that she should contact her insurance company and ask where she can be referred to in network.  Pt agreed and will call back after she gets this information.

## 2012-09-14 NOTE — Telephone Encounter (Signed)
Pt appt has been made and referral sent to [facility]  Jill at [facility] endo will give the pt the appt date and time

## 2012-09-14 NOTE — Telephone Encounter (Signed)
Linda Lane,Looking at her chart I see she has [facility]..we are not in network w/[employer] so she would be considered self pay.[name]

## 2012-09-14 NOTE — Telephone Encounter (Signed)
Message copied by Donata Duff on Thu Sep 14, 2012  9:51 AM ------      Message from: Rob Bunting P      Created: Thu Sep 14, 2012  9:48 AM       She needs rov with me in 3-4 weeks.  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            Thanks       ------

## 2012-09-14 NOTE — Telephone Encounter (Signed)
Ok.  I"m happy to refer her to whatever bariatric surgeon she wants in her network.  Maybe she or her daughter can call with a few names that are in network

## 2012-09-14 NOTE — Telephone Encounter (Signed)
Dr Christella Hartigan [facility] is not in network with the pt's insurance and they will also need her records.  I will call the pt and let her know about the insurance, is there anything else you want to refer her?

## 2012-09-14 NOTE — Op Note (Signed)
[facility] [address] [place], [address]   ENDOSCOPY PROCEDURE REPORT  PATIENT: Lane, Linda  MR#: [id number] BIRTHDATE: 1940/05/27 , 71  yrs. old GENDER: Female ENDOSCOPIST: Rachael Fee, MD PROCEDURE DATE:  09/14/2012 PROCEDURE:  EGD w/ biopsy, egd with balloon dilation of GE junction stenosis ASA CLASS:     Class III INDICATIONS:  several bariatric surgeries, last was in [place] and was a Roux bypass surgery; now with dysphagia, weight loss. MEDICATIONS: MAC sedation, administered by CRNA TOPICAL ANESTHETIC: Cetacaine Spray  DESCRIPTION OF PROCEDURE: After the risks benefits and alternatives of the procedure were thoroughly explained, informed consent was obtained.  The Pentax Gastroscope [id number] endoscope was introduced through the mouth and advanced to the proximal jejunum. Without limitations.  The instrument was slowly withdrawn as the mucosa was fully examined.   There was a smooth, benign appearing stenosis at GE junction.  The lumen was 15mm, and it was dilated with CRE TTS balloon inflated to 20mm held for 1 minute.  There was no resultant bleeding.  The gastric pouch appeared to be normally sized.  The gastro-jejunal anastomosis was normal.  Directly adjacent to the g-j anastomosis was a clear gastro-gastric fistula opening (about 5mm lumen).  I changed to a pediatric gastroscope and passed through the fisula opening easily, confirming that it leads to her distal stomach. There was moder distal gastritis, biospies taken and sent to pathology.  Retroflexed views revealed no abnormalities.     The scope was then withdrawn from the patient and the procedure completed. COMPLICATIONS: There were no complications.  ENDOSCOPIC IMPRESSION: There was a smooth, benign appearing stenosis at GE junction; dilated today. The gastric pouch appeared to be normally sized.  The gastro-jejunal anastomosis was normal. Gastro-gastric fistula confirmed.  Distal gastritic biopsied to check for H. pylori  RECOMMENDATIONS: Await biopsies.  If positive for H.  pylori, you will be started on appropriate antibiotics.  My office will call you to set up return visit in my office in 3-4 weeks to check your response to dilation. Will also set up referral to bariatric surgeon at [facility] to discuss what, if anything, needs to be done about the gastro-gastric fistula that connects the two parts of your stomach now.   eSigned:  Rachael Fee, MD 09/14/2012 9:46 AM

## 2012-09-14 NOTE — Anesthesia Postprocedure Evaluation (Signed)
Anesthesia Post Note  Patient: Linda Lane  Procedure(s) Performed: Procedure(s) (LRB): ESOPHAGOGASTRODUODENOSCOPY (EGD) (N/A) BALLOON DILATION (N/A)  Anesthesia type: MAC  Patient location: PACU  Post pain: Pain level controlled  Post assessment: Post-op Vital signs reviewed  Last Vitals: BP 127/74  Temp(Src) 36.8 C (Oral)  Resp 24  SpO2 99%  LMP 04/05/1988  Post vital signs: Reviewed  Level of consciousness: awake  Complications: No apparent anesthesia complications

## 2012-09-14 NOTE — Transfer of Care (Signed)
Immediate Anesthesia Transfer of Care Note  Patient: Linda Lane  Procedure(s) Performed: Procedure(s): ESOPHAGOGASTRODUODENOSCOPY (EGD) (N/A) BALLOON DILATION (N/A)  Patient Location: PACU  Anesthesia Type:MAC  Level of Consciousness: awake, alert , oriented and patient cooperative  Airway & Oxygen Therapy: Patient Spontanous Breathing and Patient connected to nasal cannula oxygen  Post-op Assessment: Report given to PACU RN  Post vital signs: stable  Complications: No apparent anesthesia complications

## 2012-09-14 NOTE — Telephone Encounter (Signed)
Message copied by Donata Duff on Thu Sep 14, 2012 12:13 PM ------      Message from: Zacarias Pontes      Created: Thu Sep 14, 2012 11:57 AM       Kieon Lawhorn I will need pt`s records from her gastric bypass sx that was done in [place] before I can sched pt ...will you have them faxed to Coy [facility]as bariatric coordinator at [phone] I will let her know to be on lookout for them.Marland KitchenMarland KitchenLiborio Nixon       ----- Message -----         From: Donata Duff, CMA         Sent: 09/14/2012   9:56 AM           To: Zacarias Pontes            referral to bariatric surgery to 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                        ------

## 2012-09-14 NOTE — H&P (View-Only) (Signed)
Review of pertinent gastrointestinal problems: 1. Small adenomatous polyp in 2008 on colonoscopy.  Repeat colonoscopy 02/2012 found no adenomatous polyps, +left sided diverticulosis; recall colonoscopy at 10 year interval.   HPI: This is a    very pleasant 72 year old woman whom I last saw about a year ago. She is here with her daughter-in-law to discuss a new problem today.  Trouble swallowing, she says she has no real problem with this but her pro  Drinking water or eating solids causes dysphagia.  CAuses pain as well.  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  Complicated by bleeding at some point.  Unclear surgeries, but her eventual gastric bypass was done in [place].   Swallowing issues for about 1 month ago.    Has lost at least 11 pounds, can tell she has lost weight recently. (acutally 25 pounds in 10 months per patient).  No pyrosis on BID PPI.  One parent had ulcers.  Takes no NSAIDs.  Review of systems: Pertinent positive and negative review of systems were noted in the above HPI section. Complete review of systems was performed and was otherwise normal.    Past Medical History  Diagnosis Date  . HTN (hypertension)   . Meningitis     with right ear complete deafness  . History of gastroesophageal reflux (GERD)   . Peptic ulcer disease   . CAD (coronary artery disease)   . SOB (shortness of breath)   . Hyperlipidemia   . History of colon polyps   . Cancer 2005    history of melanoma    Past Surgical History  Procedure Laterality Date  . Middle ear surgery Bilateral   . Abdominal hysterectomy    . Cholecystectomy    . Appendectomy    . Bilateral breast implants    . Laparoscopic gastric banding    . Gastric bypass    . Tonsillectomy  1962  . Pacemaker placement  1999, 2007, 2010  . Melanoma excision  2005    beneath chin    Current Outpatient Prescriptions  Medication Sig  Dispense Refill  . amLODipine (NORVASC) 5 MG tablet Take 5 mg by mouth daily.       Marland Kitchen aspirin 81 MG tablet Take 81 mg by mouth daily.      Marland Kitchen atenolol (TENORMIN) 25 MG tablet Take 1 tablet (25 mg total) by mouth daily.  90 tablet  3  . atorvastatin (LIPITOR) 10 MG tablet Take 1 tablet (10 mg total) by mouth daily.  90 tablet  3  . lansoprazole (PREVACID) 30 MG capsule Take 1 capsule (30 mg total) by mouth 2 (two) times daily.  60 capsule  1  . oxyCODONE (ROXICODONE) 15 MG immediate release tablet Take 1 tablet (15 mg total) by mouth every 6 (six) hours as needed for pain.  90 tablet  0  . lisinopril (PRINIVIL,ZESTRIL) 20 MG tablet ON HOLD starting 08/15/12  90 tablet  3   No current facility-administered medications for this visit.    Allergies as of 09/11/2012  . (No Known Allergies)    Family History  Problem Relation Age of Onset  . Heart attack Mother   . Stroke Mother   . Ulcers Father   . Stroke Father   . Hypertension Brother   . Hypertension Sister   . Heart disease Maternal Grandmother   . Heart disease Maternal Grandfather   . Colon cancer Neg Hx  History   Social History  . Marital Status: Divorced    Spouse Name: N/A    Number of Children: 2  . Years of Education: N/A   Occupational History  . Not on file.   Social History Main Topics  . Smoking status: Current Every Day Smoker -- 0.50 packs/day    Types: Cigarettes  . Smokeless tobacco: Never Used  . Alcohol Use: 1 - 1.5 oz/week    2-3 drink(s) per week     Comment: social  . Drug Use: No  . Sexually Active: Not on file   Other Topics Concern  . Not on file   Social History Narrative  . No narrative on file       Physical Exam: BP 106/70  Pulse 76  Ht 5' 1.75" (1.568 m)  Wt 146 lb 8 oz (66.452 kg)  BMI 27.03 kg/m2  LMP 04/05/1988 Constitutional: generally well-appearing Psychiatric: alert and oriented x3 Eyes: extraocular movements intact Mouth: oral pharynx moist, no lesions Neck:  supple no lymphadenopathy Cardiovascular: heart regular rate and rhythm Lungs: clear to auscultation bilaterally Abdomen: soft, nontender, nondistended, no obvious ascites, no peritoneal signs, normal bowel sounds Extremities: no lower extremity edema bilaterally Skin: no lesions on visible extremities  no lower extremity pitting  edema   Assessment and plan: 72 y.o. female with  weight loss, dysphagia, early satiety  She has had several gastric surgeries. Many of these were done in [place]. Eventually she had surgery in [place] for a gastric bypass in her daughter-in-law leads it was indeed a Roux gastric bypass. She had been doing fine for a long time but in the past month to 3 or 4 months she has  had dysphagia, early satiety, unintentional weight loss.  Perhaps she has an anastomotic stricture, peptic ulcer disease, neoplasm. I will like to proceed with EGD at her soonest convenience. She was in a cardiology office with some shortness of breath when laying down however cardiac wise she seems to be very stable. Today her lungs are very clear and she has no pitting edema. A month ago her BNP was completely normal.

## 2012-09-14 NOTE — Anesthesia Preprocedure Evaluation (Addendum)
Anesthesia Evaluation  Patient identified by MRN, date of birth, ID band Patient awake    Reviewed: Allergy & Precautions, H&P , NPO status , Patient's Chart, lab work & pertinent test results  History of Anesthesia Complications Negative for: history of anesthetic complications  Airway       Dental  (+) Dental Advisory Given   Pulmonary neg pulmonary ROS, shortness of breath and with exertion, Current Smoker,          Cardiovascular hypertension, Pt. on medications + CAD negative cardio ROS   DDDR pacer, 84% atrial and 4% ventricular pacing.   Neuro/Psych negative neurological ROS  negative psych ROS   GI/Hepatic Neg liver ROS, PUD,   Endo/Other  negative endocrine ROS  Renal/GU negative Renal ROS     Musculoskeletal negative musculoskeletal ROS (+)   Abdominal   Peds  Hematology negative hematology ROS (+)   Anesthesia Other Findings   Reproductive/Obstetrics negative OB ROS                         Anesthesia Physical Anesthesia Plan  ASA: III  Anesthesia Plan: General   Post-op Pain Management:    Induction: Intravenous  Airway Management Planned: Oral ETT  Additional Equipment:   Intra-op Plan:   Post-operative Plan: Extubation in OR  Informed Consent: I have reviewed the patients History and Physical, chart, labs and discussed the procedure including the risks, benefits and alternatives for the proposed anesthesia with the patient or authorized representative who has indicated his/her understanding and acceptance.   Dental advisory given  Plan Discussed with: CRNA  Anesthesia Plan Comments:         Anesthesia Quick Evaluation

## 2012-09-15 ENCOUNTER — Telehealth: Payer: Self-pay | Admitting: *Deleted

## 2012-09-15 NOTE — Telephone Encounter (Signed)
Received message from pt that she will be unable to pick up oxycodone rx until her appt on 09/26/12 as she is unable to drive and cannot arrange transportation. Per verbal ok from Provider advised pt we will mail rx if she is agreeable but we will be unable to provide additional rx for 30 more days if Rx is lost in the mail.  Pt voices understanding and is agreeable to proceed with mail. Rx mailed.

## 2012-09-19 ENCOUNTER — Telehealth: Payer: Self-pay | Admitting: Family

## 2012-09-19 ENCOUNTER — Encounter: Payer: Self-pay | Admitting: Family

## 2012-09-19 DIAGNOSIS — M810 Age-related osteoporosis without current pathological fracture: Secondary | ICD-10-CM

## 2012-09-19 HISTORY — DX: Age-related osteoporosis without current pathological fracture: M81.0

## 2012-09-19 MED ORDER — ALENDRONATE SODIUM 70 MG PO TABS: 70.0000 mg | ORAL_TABLET | ORAL | Status: DC

## 2012-09-19 NOTE — Telephone Encounter (Signed)
Please call pt and let her know that her bone density confirms osteoporosis.  I would like her to start fosamax to help maintain and build her bones.  In addition I would like to check her vitamin D level. We will plan to do that at her upcoming appointment.

## 2012-09-20 NOTE — Telephone Encounter (Signed)
Called pt again still no answer LMOM RTC.../lmb 

## 2012-09-20 NOTE — Telephone Encounter (Signed)
Called pt no answer LMOM RTC.../lmb 

## 2012-09-20 NOTE — Telephone Encounter (Signed)
Notified pt with Linda Lane response. She stated Dr. Edmon Crape in [place] check her Vit d and she was taking vitamin d 50,0000 weekly for 8 weeks. He recheck vitamin d & it was back normal. He recommend that she take vitamin d3 once a day. Inform pt will add to her med list.../lmb

## 2012-09-26 ENCOUNTER — Ambulatory Visit: Payer: Medicare HMO | Admitting: Family

## 2012-09-27 ENCOUNTER — Encounter: Payer: Self-pay | Admitting: Family

## 2012-09-27 ENCOUNTER — Ambulatory Visit (INDEPENDENT_AMBULATORY_CARE_PROVIDER_SITE_OTHER): Payer: Medicare HMO | Admitting: Family

## 2012-09-27 VITALS — BP 130/88 | HR 80 | Temp 98.0°F | Resp 16 | Ht 63.25 in | Wt 141.0 lb

## 2012-09-27 DIAGNOSIS — I10 Essential (primary) hypertension: Secondary | ICD-10-CM

## 2012-09-27 DIAGNOSIS — IMO0002 Reserved for concepts with insufficient information to code with codable children: Secondary | ICD-10-CM

## 2012-09-27 DIAGNOSIS — S22000S Wedge compression fracture of unspecified thoracic vertebra, sequela: Secondary | ICD-10-CM

## 2012-09-27 DIAGNOSIS — M810 Age-related osteoporosis without current pathological fracture: Secondary | ICD-10-CM

## 2012-09-27 MED ORDER — ATENOLOL 25 MG PO TABS: 25.0000 mg | ORAL_TABLET | Freq: Every morning | ORAL | Status: DC

## 2012-09-27 MED ORDER — AMLODIPINE BESYLATE 5 MG PO TABS: 5.0000 mg | ORAL_TABLET | Freq: Every morning | ORAL | Status: DC

## 2012-09-27 MED ORDER — ALENDRONATE SODIUM 70 MG PO TABS: 70.0000 mg | ORAL_TABLET | ORAL | Status: DC

## 2012-09-27 MED ORDER — LANSOPRAZOLE 30 MG PO CPDR: 30.0000 mg | DELAYED_RELEASE_CAPSULE | Freq: Two times a day (BID) | ORAL | Status: DC

## 2012-09-27 MED ORDER — OXYCODONE HCL 15 MG PO TABS: 15.0000 mg | ORAL_TABLET | Freq: Four times a day (QID) | ORAL | Status: DC | PRN

## 2012-09-27 MED ORDER — LISINOPRIL 10 MG PO TABS: 10.0000 mg | ORAL_TABLET | Freq: Every day | ORAL | Status: DC

## 2012-09-27 MED ORDER — ATORVASTATIN CALCIUM 10 MG PO TABS: 10.0000 mg | ORAL_TABLET | Freq: Every evening | ORAL | Status: DC

## 2012-09-27 NOTE — Progress Notes (Signed)
Subjective:    Patient ID: Linda Lane, female    DOB: 1941/03/31, 72 y.o.   MRN: [id number]  HPI  Osteoporosis- Had compression fracture which she reports NS managed with back brace and pain medication only. She was not candidate or KP.  She has been placed on the fosamax. She continues to have pain in the middle of her back.  She is full of medication.  She reports pain is 7/10. Pain is non-radiating.    HTN- currently maintained on amlodipine only.  Lisinopril was discontinued last visit.  Notes LE swelling which she finds bothersome.   Review of Systems    see HPI  Past Medical History  Diagnosis Date  . HTN (hypertension)   . 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 History of gastroesophageal reflux (GERD)   . Peptic ulcer disease   . Hyperlipidemia   . History of colon polyps   . CAD (coronary artery disease)   . SOB (shortness of breath)   . Cancer 2005    history of melanoma face  . Complication of anesthesia 2003 and 2004    woke up during colonscopy  . Ankle edema for last 1 week    both ankles  . Osteoporosis   . Osteoporosis, unspecified 09/19/2012    History   Social History  . Marital Status: Divorced    Spouse Name: N/A    Number of Children: 2  . Years of Education: N/A   Occupational History  . Not on file.   Social History Main Topics  . Smoking status: Current Every Day Smoker -- 0.50 packs/day    Types: Cigarettes  . Smokeless tobacco: Never Used  . Alcohol Use: No     Comment: no alcohol since jan 2014  . Drug Use: No  . Sexually Active: Not on file   Other Topics Concern  . Not on file   Social History Narrative  . No narrative on file    Past Surgical History  Procedure Laterality Date  . Middle ear surgery Bilateral   . Abdominal hysterectomy    . Cholecystectomy    . Appendectomy    . Bilateral breast implants    . Laparoscopic gastric banding    . Gastric bypass    . Tonsillectomy  1962  . Pacemaker  placement  1999, 2007, 2010  . Melanoma excision  2005    beneath chin  . Esophagogastroduodenoscopy N/A 09/14/2012    Procedure: ESOPHAGOGASTRODUODENOSCOPY (EGD);  Surgeon: Rachael Fee, MD;  Location: [facility];  Service: Endoscopy;  Laterality: N/A;  . Balloon dilation N/A 09/14/2012    Procedure: BALLOON DILATION;  Surgeon: Rachael Fee, MD;  Location: [facility];  Service: Endoscopy;  Laterality: N/A;    Family History  Problem Relation Age of Onset  . Heart attack Mother   . Stroke Mother   . Ulcers Father   . Stroke Father   . Hypertension Brother   . Hypertension Sister   . Heart disease Maternal Grandmother   . Heart disease Maternal Grandfather   . Colon cancer Neg Hx     No Known Allergies  Current Outpatient Prescriptions on File Prior to Visit  Medication Sig Dispense Refill  . aspirin 81 MG tablet Take 81 mg by mouth daily.      . calcium carbonate (OS-CAL) 600 MG TABS Take 600 mg by mouth daily.      . Cholecalciferol (VITAMIN D-3) 1000 UNITS CAPS Take 1,000  Units by mouth daily.       No current facility-administered medications on file prior to visit.    BP 130/88  Pulse 80  Temp(Src) 98 F (36.7 C) (Oral)  Resp 16  Ht 5' 3.25" (1.607 m)  Wt 141 lb 0.6 oz (63.975 kg)  BMI 24.77 kg/m2  SpO2 97%  LMP 04/05/1988    Objective:   Physical Exam  Constitutional: She is oriented to person, place, and time. She appears well-developed and well-nourished. No distress.  Cardiovascular: Normal rate and regular rhythm.   No murmur heard. Pulmonary/Chest: Effort normal and breath sounds normal. No respiratory distress. She has no wheezes. She has no rales. She exhibits no tenderness.  Musculoskeletal:  2+ bilateral LE edema  Neurological: She is alert and oriented to person, place, and time.  Psychiatric: She has a normal mood and affect. Her behavior is normal. Judgment and thought content normal.          Assessment & Plan:

## 2012-09-27 NOTE — Patient Instructions (Signed)
Stop amlodipine, start lisinopril. Call me in 1 week with your blood pressure readings. Follow up in 3 months.

## 2012-09-28 NOTE — Assessment & Plan Note (Signed)
Continues to have residual chronic back pain.  Using oxycodone.  Rx provided today, controlled substance contract signed.

## 2012-09-28 NOTE — Assessment & Plan Note (Signed)
Not tolerating fosamax well due to GI side effects. Will try to transition her to prolia if we can get insurance approval.

## 2012-09-28 NOTE — Assessment & Plan Note (Signed)
BP stable but she is bothered by the swelling which is likely due to amlodipine.  Will d/c amlodipine and restart lisinopril at 10 mg.  Plan follow up in for bp recheck and bmet.

## 2012-10-02 ENCOUNTER — Telehealth: Payer: Self-pay | Admitting: *Deleted

## 2012-10-02 NOTE — Telephone Encounter (Signed)
Received call from pt that we refilled her wrong blood pressure medication. Pt was told to stop amlodipine at last visit and restart lisinopril.  States that she did not get lisinopril rx but she has supply on hand from before that will last her a month. Pt also states that she usually gets 90 day supply at a time. Updated med list. [facility] and advised pt to call me when she is do for refill on medications and remind [place] that she would like a 90 day supply and we can refill all at that time. [facility].

## 2012-10-10 ENCOUNTER — Telehealth: Payer: Self-pay | Admitting: *Deleted

## 2012-10-10 NOTE — Telephone Encounter (Signed)
Patient reports that her BP was 170/80 yesterday [has not checked today, as daughter does this for her]; had change made in HTN medication at office visit on 06.25.14 and request direction on what changes need to be made [pt is Asymptomatic], pt also reports side effects of anxiousness w/Oxycodone & would like instructions on weaning off of medication. Patient agreed to office visit for these medical issues, but declined appt prior to Fri, 07.11.14, d/t only time she will have transportation, scheduled for 10:00a. Pt will report to emergent facility if new or worsening symptoms arise prior to scheduled OV/SLS

## 2012-10-13 ENCOUNTER — Encounter: Payer: Self-pay | Admitting: Nurse Practitioner

## 2012-10-13 ENCOUNTER — Ambulatory Visit (INDEPENDENT_AMBULATORY_CARE_PROVIDER_SITE_OTHER): Payer: Medicare HMO | Admitting: Nurse Practitioner

## 2012-10-13 ENCOUNTER — Telehealth: Payer: Self-pay | Admitting: Family

## 2012-10-13 VITALS — BP 124/82 | HR 64 | Temp 98.4°F | Resp 14 | Wt 139.8 lb

## 2012-10-13 DIAGNOSIS — I10 Essential (primary) hypertension: Secondary | ICD-10-CM

## 2012-10-13 DIAGNOSIS — R600 Localized edema: Secondary | ICD-10-CM

## 2012-10-13 DIAGNOSIS — R609 Edema, unspecified: Secondary | ICD-10-CM

## 2012-10-13 DIAGNOSIS — M549 Dorsalgia, unspecified: Secondary | ICD-10-CM

## 2012-10-13 MED ORDER — ATENOLOL 25 MG PO TABS: 25.0000 mg | ORAL_TABLET | Freq: Every morning | ORAL | Status: DC

## 2012-10-13 MED ORDER — LISINOPRIL 10 MG PO TABS: 10.0000 mg | ORAL_TABLET | Freq: Every day | ORAL | Status: DC

## 2012-10-13 MED ORDER — OXYCODONE-ACETAMINOPHEN 10-325 MG PO TABS: 1.0000 | ORAL_TABLET | Freq: Four times a day (QID) | ORAL | Status: DC | PRN

## 2012-10-13 MED ORDER — LANSOPRAZOLE 30 MG PO CPDR: 30.0000 mg | DELAYED_RELEASE_CAPSULE | Freq: Two times a day (BID) | ORAL | Status: DC

## 2012-10-13 MED ORDER — ATORVASTATIN CALCIUM 10 MG PO TABS: 10.0000 mg | ORAL_TABLET | Freq: Every evening | ORAL | Status: DC

## 2012-10-13 NOTE — Telephone Encounter (Signed)
Refills sent

## 2012-10-13 NOTE — Progress Notes (Signed)
Subjective:    Patient is accompanied by her daughter-in-law, who is an Charity fundraiser, and is here for follow-up of elevated blood pressure. Her medicines were adjusted by her cardiologist and recently by PCP: Cardiology d/c lisinopril. Due to report of dizzyness & lower ext swelling, PCP d/c amlodopine and restarted lisinopril at 10 mg. Per daughter in law, she is not certain if pt is taking 10 mg or 20 mg lisinopril.  She is not exercising and is adherent to a low-salt diet.  Blood pressure is well controlled at home. Cardiac symptoms: none. She continues to have lower extremity edema. Patient denies: chest pain, chest pressure/discomfort, dyspnea, fatigue and irregular heart beat. Cardiovascular risk factors: advanced age (older than 58 for men, 81 for women), dyslipidemia and sedentary lifestyle. Use of agents associated with hypertension: none. History of target organ damage: none. Additionally, patient wants to wean from oxycodone which was prescribed for back pain r/t vertebral fracture. Daughter-in-law states she is currently 15 mg tabs -up to 5 daily. Pt states sometimes she forgets if she has taken them. Daughter-in-law is concerned that Linda Lane is becoming forgetful & plans to assist with meds.  The following portions of the patient's history were reviewed and updated as appropriate: allergies, current medications, past medical history, past social history and problem list.  Review of Systems Constitutional: positive for nothing, negative for anorexia, fatigue, fevers and night sweats Eyes: negative for visual disturbance Respiratory: negative for cough, dyspnea on exertion and wheezing Cardiovascular: negative for chest pain, chest pressure/discomfort, irregular heart beat, palpitations and positive for ongoing lower extremity edema Musculoskeletal:positive for back pain, negative for muscle weakness Neurological: positive for memory problems, negative for coordination problems, dizziness, gait  problems, headaches, seizures and weakness Behavioral/Psych: positive for sleep disturbance, negative for anxiety, decreased appetite, depression and irritability     Objective:    BP 124/82  Pulse 64  Temp(Src) 98.4 F (36.9 C) (Oral)  Resp 14  Wt 139 lb 12 oz (63.39 kg)  BMI 24.55 kg/m2  SpO2 97%  LMP 04/05/1988 General appearance: alert, cooperative, appears stated age and no distress Head: Normocephalic, without obvious abnormality, atraumatic Lungs: clear to auscultation bilaterally Heart: regular rate and rhythm, S1, S2 normal, no murmur, click, rub or gallop Extremities: edema +2 pitting to knees, no ulcers, gangrene or trophic changes and varicose veins noted    Assessment:    Hypertension, normal blood pressure , currently taking atenolol 25 mg qd, & lisinopril-not sure if 10 or 20 mg. Evidence of target organ damage: none.   Wean off pain meds-currently taking oxycodone 15 mgt, 5 T daily. Daughter-in-law thinks she has had hallucinations as she has tried to cut back. Plan:    Medication: continue atenolol 25 mg qd & lisinopril 10 mg qd. Check blood pressures 2-3 times weekly and record. Follow up: 1 month and as needed.  Wean oxycodone: cut dose in half for 4-7 days, continue to half dose for 5-7 days until not taking or taking 1-2 tabs daily. Discussed monitoring for signs of withdrawal. Dtr-in-law plans to put meds out in pill box to help regulate. See pt instructions.

## 2012-10-13 NOTE — Patient Instructions (Signed)
No changes to blood pressure meds today. Please have daughter in law monitor blood pressure at home every few days as you wean off oxycodone. It should stay below 150/90. Please decrease your oxycodone by half daily for 5-7 days if tolerated, then decrease by half again for 5-7 days, then again until you are no longer taking or until you are taking only 5-10 mg 1-2 times daily. Watch for signs of withdrawal such as elevated blood pressure, irritability, hallucination, dizziness, tremors. If these occur, go back to the dose at which you did not have these symptoms for a few more days. Pleasure to meet you!

## 2012-10-13 NOTE — Telephone Encounter (Signed)
Patient is requesting a 90 day supply of these medications.   Atenolol  Atorvastatin  Prevacid  lisinopril

## 2012-10-17 ENCOUNTER — Ambulatory Visit: Payer: Medicare HMO | Admitting: Gastroenterology

## 2012-10-27 ENCOUNTER — Telehealth: Payer: Self-pay | Admitting: *Deleted

## 2012-10-27 DIAGNOSIS — M549 Dorsalgia, unspecified: Secondary | ICD-10-CM

## 2012-10-27 MED ORDER — OXYCODONE-ACETAMINOPHEN 10-325 MG PO TABS: 1.0000 | ORAL_TABLET | Freq: Four times a day (QID) | ORAL | Status: DC | PRN

## 2012-10-27 NOTE — Telephone Encounter (Signed)
Received message from pt requesting rx for oxycodone. Spoke with pt re: 10/13/12 office note about weaning off of medication. Pt states she is currently taking 1 tablet every 5 hours and has been unable to wean down to the 1/2 tablet consistently. States she tried this for 1 day and her leg trembled and jumped so bad that she went back on 1 tablet. Last rx was printed on 10/13/12 #60.  Please advise.

## 2012-10-27 NOTE — Telephone Encounter (Signed)
Notified pt. She voices understanding and rx placed at front desk for pick up.

## 2012-10-27 NOTE — Telephone Encounter (Signed)
See printed Rx. I have provided her with 90 tabs.  I would like her to try to keep her dosing to 3 tabs/day total if possible.

## 2012-11-09 ENCOUNTER — Telehealth: Payer: Self-pay | Admitting: *Deleted

## 2012-11-09 NOTE — Telephone Encounter (Signed)
Message copied by Kathi Simpers on Thu Nov 09, 2012 10:09 AM ------      Message from: O'SULLIVAN, MELISSA      Created: Wed Sep 27, 2012  9:30 AM       Pt is interested in Prolia. She is not tolerating fosamax due to GI side effects.  Could you please look into that? thanks ------

## 2012-11-09 NOTE — Telephone Encounter (Signed)
Insurance cards and verification form faxed to [employer] at [phone]. 

## 2012-11-20 ENCOUNTER — Telehealth: Payer: Self-pay | Admitting: *Deleted

## 2012-11-20 DIAGNOSIS — M549 Dorsalgia, unspecified: Secondary | ICD-10-CM

## 2012-11-20 MED ORDER — OXYCODONE-ACETAMINOPHEN 10-325 MG PO TABS: 1.0000 | ORAL_TABLET | Freq: Four times a day (QID) | ORAL | Status: DC | PRN

## 2012-11-20 NOTE — Telephone Encounter (Signed)
Received message from pt stating she has tried limiting oxycodone to three a day but states she has been unable to do this and has had to take 4 a day. Last refill 10/27/12, #90.  Pt is requesting Rx by Thursday. Please advise.

## 2012-11-20 NOTE — Telephone Encounter (Addendum)
I am not comfortable increasing the dosing frequency as she is already on a high dose and daughter reported some hallucinations when she was taking higher doses. I would like her to try to limit to 3 tabs a day.  At this point, I think referral to pain management is appropriate.  Pended below. OK to refill # 90 tabs on Thursday.  Will arrange pain management referral.

## 2012-11-20 NOTE — Telephone Encounter (Signed)
Notified pt and she is agreeable to proceed with pain management referral. Rx printed and placed at front desk for pick up with notation on Rx not to fill before 11/23/12.

## 2012-11-29 ENCOUNTER — Encounter: Payer: Self-pay | Admitting: Physical Medicine & Rehabilitation

## 2012-12-15 ENCOUNTER — Telehealth: Payer: Self-pay | Admitting: *Deleted

## 2012-12-15 DIAGNOSIS — M549 Dorsalgia, unspecified: Secondary | ICD-10-CM

## 2012-12-15 MED ORDER — OXYCODONE-ACETAMINOPHEN 10-325 MG PO TABS: 1.0000 | ORAL_TABLET | Freq: Four times a day (QID) | ORAL | Status: DC | PRN

## 2012-12-15 NOTE — Telephone Encounter (Signed)
Received message from pt requesting to pick up Oxycodone rx on Monday or Tuesday of next week. Last rx was filled on 11/24/12.  Please advise.

## 2012-12-15 NOTE — Telephone Encounter (Signed)
See Rx 

## 2012-12-15 NOTE — Telephone Encounter (Signed)
Left detailed message on voicemail that rx can be picked up next. Rx left at front desk.

## 2012-12-15 NOTE — Telephone Encounter (Signed)
Pt also states she has appt with pain management on 12/26/12 and wants refill until she can establish with them.

## 2012-12-26 ENCOUNTER — Ambulatory Visit (HOSPITAL_BASED_OUTPATIENT_CLINIC_OR_DEPARTMENT_OTHER): Payer: Medicare HMO | Admitting: Physical Medicine & Rehabilitation

## 2012-12-26 ENCOUNTER — Encounter: Payer: Self-pay | Admitting: Physical Medicine & Rehabilitation

## 2012-12-26 ENCOUNTER — Encounter: Payer: Medicare HMO | Attending: Physical Medicine & Rehabilitation

## 2012-12-26 VITALS — BP 128/63 | HR 62 | Resp 14 | Ht 63.0 in | Wt 139.0 lb

## 2012-12-26 DIAGNOSIS — M549 Dorsalgia, unspecified: Secondary | ICD-10-CM

## 2012-12-26 DIAGNOSIS — Z79899 Other long term (current) drug therapy: Secondary | ICD-10-CM

## 2012-12-26 DIAGNOSIS — Z5181 Encounter for therapeutic drug level monitoring: Secondary | ICD-10-CM

## 2012-12-26 DIAGNOSIS — R269 Unspecified abnormalities of gait and mobility: Secondary | ICD-10-CM | POA: Insufficient documentation

## 2012-12-26 DIAGNOSIS — R279 Unspecified lack of coordination: Secondary | ICD-10-CM | POA: Insufficient documentation

## 2012-12-26 DIAGNOSIS — IMO0002 Reserved for concepts with insufficient information to code with codable children: Secondary | ICD-10-CM

## 2012-12-26 DIAGNOSIS — S22000G Wedge compression fracture of unspecified thoracic vertebra, subsequent encounter for fracture with delayed healing: Secondary | ICD-10-CM

## 2012-12-26 DIAGNOSIS — M810 Age-related osteoporosis without current pathological fracture: Secondary | ICD-10-CM | POA: Insufficient documentation

## 2012-12-26 DIAGNOSIS — Z87311 Personal history of (healed) other pathological fracture: Secondary | ICD-10-CM | POA: Insufficient documentation

## 2012-12-26 NOTE — Progress Notes (Signed)
Subjective:    Patient ID: Linda Lane, female    DOB: 10/30/1940, 72 y.o.   MRN: [id number]  HPI 2 falls in Jan and Feb of this year.     Hx of osteoporosis, eval by PCP and Orthopedist, treated with alandronate Also had thoracic orthosis,  Taking oxycodone, 40-50 mg per day Has reduced oxycodone from 15 mg to 10 mg  History of balance problems relating to meningitis several years ago Bone densitometry T score -3 to -4 in the lumbar spine Pain Inventory Average Pain 7 Pain Right Now 9 My pain is dull, aching and other  In the last 24 hours, has pain interfered with the following? General activity 8 Relation with others 6 Enjoyment of life 9 What TIME of day is your pain at its worst? all Sleep (in general) Poor  Pain is worse with: walking, bending, standing and some activites Pain improves with: rest and medication Relief from Meds: 8  Mobility walk without assistance use a walker how many minutes can you walk? 15 do you drive?  no  Function I need assistance with the following:  meal prep, household duties and shopping  Neuro/Psych weakness trouble walking dizziness  Prior Studies Any changes since last visit?  no  Physicians involved in your care Primary care [facility]   Family History  Problem Relation Age of Onset  . Heart attack Mother   . Stroke Mother   . Ulcers Father   . Stroke Father   . Hypertension Brother   . Hypertension Sister   . Heart disease Maternal Grandmother   . Heart disease Maternal Grandfather   . Colon cancer Neg Hx    History   Social History  . Marital Status: Divorced    Spouse Name: N/A    Number of Children: 2  . Years of Education: N/A   Social History Main Topics  . Smoking status: Current Every Day Smoker -- 0.50 packs/day    Types: Cigarettes  . Smokeless tobacco: Never Used  . Alcohol Use: No     Comment: no alcohol since jan 2014  . Drug Use: No  . Sexual Activity: None   Other Topics  Concern  . None   Social History Narrative  . None   Past Surgical History  Procedure Laterality Date  . Middle ear surgery Bilateral   . Abdominal hysterectomy    . Cholecystectomy    . Appendectomy    . Bilateral breast implants    . Laparoscopic gastric banding    . Gastric bypass    . Tonsillectomy  1962  . Pacemaker placement  1999, 2007, 2010  . Melanoma excision  2005    beneath chin  . Esophagogastroduodenoscopy N/A 09/14/2012    Procedure: ESOPHAGOGASTRODUODENOSCOPY (EGD);  Surgeon: Rachael Fee, MD;  Location: [facility];  Service: Endoscopy;  Laterality: N/A;  . Balloon dilation N/A 09/14/2012    Procedure: BALLOON DILATION;  Surgeon: Rachael Fee, MD;  Location: [facility];  Service: Endoscopy;  Laterality: N/A;   Past Medical History  Diagnosis Date  . HTN (hypertension)   . 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 History of gastroesophageal reflux (GERD)   . Peptic ulcer disease   . Hyperlipidemia   . History of colon polyps   . CAD (coronary artery disease)   . SOB (shortness of breath)   . Cancer 2005    history of melanoma face  . Complication of anesthesia 2003 and  2004    woke up during colonscopy  . Ankle edema for last 1 week    both ankles  . Osteoporosis   . Osteoporosis, unspecified 09/19/2012   BP 128/63  Pulse 62  Resp 14  Ht 5\' 3"  (1.6 m)  Wt 139 lb (63.05 kg)  BMI 24.63 kg/m2  SpO2 97%  LMP 04/05/1988    Review of Systems  Constitutional: Positive for appetite change and unexpected weight change.  Cardiovascular: Positive for leg swelling.  Musculoskeletal: Positive for back pain and gait problem.  Neurological: Positive for dizziness and weakness.       Objective:   Physical Exam  Nursing note and vitals reviewed. Constitutional: She is oriented to person, place, and time. She appears well-developed and well-nourished.  HENT:  Head: Normocephalic and atraumatic.  Eyes: Conjunctivae and EOM are  normal.  Musculoskeletal:       Thoracic back: She exhibits decreased range of motion. She exhibits no tenderness.       Lumbar back: She exhibits decreased range of motion. She exhibits no tenderness.  -SLR  Neurological: She is alert and oriented to person, place, and time. No sensory deficit.  Psychiatric: She has a normal mood and affect.   Normal strength in the lower extremities.      Assessment & Plan:  1. Osteoporosis compression fractures after fall. Risk factors include history of vestibular problem following meningitis. Has been treated with this fossa and 8 as well as vitamin D and calcium. Pain medications are gradually tapering. We discussed that minimizing these medicines will also help reduce fall risk and also allow her to resume driving at some point.  In addition, I recommend addressing the balance disorder with vestibular therapy from physical therapy. Will make a referral for this.  We'll need to check urine drug screen prior to starting oxycodone. We will start tapering from 10 mg to 7.5 over the course of a month or 2.

## 2012-12-26 NOTE — Patient Instructions (Signed)
You have a prescription at your primary care office for 10 mg oxycodone please make that up [facility] will be ordered This will be for physical therapy Return visit in one month

## 2013-01-05 ENCOUNTER — Telehealth: Payer: Self-pay

## 2013-01-05 NOTE — Telephone Encounter (Signed)
FYI:: Bjorn Loser Emergency planning/management officer at [facility]) called just to inform you that patient does not want in home therapy. Patient prefers Outpatient Therapy because she can now drive.

## 2013-01-08 ENCOUNTER — Telehealth: Payer: Self-pay

## 2013-01-08 DIAGNOSIS — IMO0002 Reserved for concepts with insufficient information to code with codable children: Secondary | ICD-10-CM

## 2013-01-08 NOTE — Telephone Encounter (Signed)
Please order outpt PT for diagnosis of osteoporotic compression fracture

## 2013-01-08 NOTE — Telephone Encounter (Signed)
Order for therapy placed in chart.

## 2013-01-09 NOTE — Telephone Encounter (Signed)
Received benefit verification from [employer] stating there is no coverage for Prolia due to "provider is out of network and this plan has no out of network benefits".  Please advise.

## 2013-01-15 ENCOUNTER — Telehealth: Payer: Self-pay | Admitting: *Deleted

## 2013-01-15 NOTE — Telephone Encounter (Signed)
I looked into this and the reason it was not approved is because it needs to be filed under Dr. Abner Greenspan. Colin Mulders can help.  See her e-mail below. Would you be able to obtain a blank for her please? :  Hi,  I said I would send over another Prolia request for the patient, however, I just realized I do not have a blank [employer] form w/Dr. Mariel Aloe signature.  If you could fax one over with  for me, I will be more than happy to re-send.  All the blank forms I have already have doctor signature on them, I apologize for this inconvenience.  If you want me to take care of the new request my fax # is [phone].  Thank you,  Amada Jupiter. [facility] [address] [place], [place]  [address] [phone]

## 2013-01-15 NOTE — Telephone Encounter (Signed)
Received call from pt requesting refill of oxycodone. Advised pt that she will need to contact Dr Wynn Banker (pain management) for further refills and instructions. Pt voiced understanding.

## 2013-01-16 ENCOUNTER — Telehealth: Payer: Self-pay

## 2013-01-16 DIAGNOSIS — M549 Dorsalgia, unspecified: Secondary | ICD-10-CM

## 2013-01-16 MED ORDER — OXYCODONE-ACETAMINOPHEN 10-325 MG PO TABS: 1.0000 | ORAL_TABLET | Freq: Four times a day (QID) | ORAL | Status: DC | PRN

## 2013-01-16 NOTE — Telephone Encounter (Signed)
Printed RX for oxycodone to be signed. Will call patient will ready for pickup.

## 2013-01-16 NOTE — Telephone Encounter (Signed)
Patient called requesting oxycodone 10mg  refill.

## 2013-01-16 NOTE — Telephone Encounter (Signed)
Patient called again to get status of her medication refill.

## 2013-01-16 NOTE — Telephone Encounter (Signed)
Blank insurance verification form faxed to # below.

## 2013-01-17 NOTE — Telephone Encounter (Signed)
Patient aware script is ready for pick up 

## 2013-01-17 NOTE — Telephone Encounter (Signed)
Received call from [facility], she states that form needs to be resubmitted with Dr Mariel Aloe signature. Advised her I will resubmit.

## 2013-01-22 ENCOUNTER — Ambulatory Visit (HOSPITAL_BASED_OUTPATIENT_CLINIC_OR_DEPARTMENT_OTHER): Payer: Commercial Managed Care - HMO | Admitting: Physical Medicine & Rehabilitation

## 2013-01-22 ENCOUNTER — Encounter: Payer: Self-pay | Admitting: Physical Medicine & Rehabilitation

## 2013-01-22 ENCOUNTER — Encounter: Payer: Medicare HMO | Attending: Physical Medicine & Rehabilitation

## 2013-01-22 VITALS — BP 131/82 | HR 85 | Resp 14 | Ht 63.0 in | Wt 139.4 lb

## 2013-01-22 DIAGNOSIS — IMO0002 Reserved for concepts with insufficient information to code with codable children: Secondary | ICD-10-CM

## 2013-01-22 DIAGNOSIS — R279 Unspecified lack of coordination: Secondary | ICD-10-CM | POA: Insufficient documentation

## 2013-01-22 DIAGNOSIS — M810 Age-related osteoporosis without current pathological fracture: Secondary | ICD-10-CM | POA: Insufficient documentation

## 2013-01-22 DIAGNOSIS — M549 Dorsalgia, unspecified: Secondary | ICD-10-CM

## 2013-01-22 DIAGNOSIS — Z87311 Personal history of (healed) other pathological fracture: Secondary | ICD-10-CM | POA: Insufficient documentation

## 2013-01-22 DIAGNOSIS — S22000G Wedge compression fracture of unspecified thoracic vertebra, subsequent encounter for fracture with delayed healing: Secondary | ICD-10-CM

## 2013-01-22 DIAGNOSIS — G8929 Other chronic pain: Secondary | ICD-10-CM

## 2013-01-22 DIAGNOSIS — R269 Unspecified abnormalities of gait and mobility: Secondary | ICD-10-CM | POA: Insufficient documentation

## 2013-01-22 MED ORDER — OXYCODONE-ACETAMINOPHEN 7.5-325 MG PO TABS: 1.0000 | ORAL_TABLET | Freq: Every day | ORAL | Status: DC | PRN

## 2013-01-22 NOTE — Progress Notes (Signed)
Subjective:    Patient ID: Linda Lane, female    DOB: 05-20-1940, 72 y.o.   MRN: [id number]  HPI 2 falls in Jan and Feb of this year.  Hx of osteoporosis, eval by PCP and Orthopedist, treated with alandronate  Also had thoracic orthosis,  Taking oxycodone, 40-50 mg per day  Has reduced oxycodone from 15 mg to 10 mg, takes 5 tabs per day History of balance problems relating to meningitis several years ago  Bone densitometry T score -3 to -4 in the lumbar spine    Review of Systems  Cardiovascular: Positive for leg swelling.  All other systems reviewed and are negative.       Objective:   Physical Exam Nursing note and vitals reviewed.  Constitutional: She is oriented to person, place, and time. She appears well-developed and well-nourished.  HENT:  Head: Normocephalic and atraumatic.  Eyes: Conjunctivae and EOM are normal.  Musculoskeletal:  Thoracic back: She exhibits decreased range of motion. She exhibits no tenderness.  Lumbar back: She exhibits decreased range of motion. She exhibits no tenderness.  -SLR  Neurological: She is alert and oriented to person, place, and time. No sensory deficit.  Psychiatric: She has a normal mood and affect.   Normal strength in the lower extremities.           Pain Inventory Average Pain 9 Pain Right Now 8 My pain is dull and aching  In the last 24 hours, has pain interfered with the following? General activity 7 Relation with others 8 Enjoyment of life 8 What TIME of day is your pain at its worst? daytime and evening Sleep (in general) Poor  Pain is worse with: walking, standing and some activites Pain improves with: rest and medication Relief from Meds: 8  Mobility how many minutes can you walk? 10 ability to climb steps?  yes do you drive?  yes  Function not employed: date last employed . I need assistance with the following:  household duties and shopping  Neuro/Psych No problems in this area  Prior  Studies Any changes since last visit?  no  Physicians involved in your care Any changes since last visit?  no   Family History  Problem Relation Age of Onset  . Heart attack Mother   . Stroke Mother   . Ulcers Father   . Stroke Father   . Hypertension Brother   . Hypertension Sister   . Heart disease Maternal Grandmother   . Heart disease Maternal Grandfather   . Colon cancer Neg Hx    History   Social History  . Marital Status: Divorced    Spouse Name: N/A    Number of Children: 2  . Years of Education: N/A   Social History Main Topics  . Smoking status: Current Every Day Smoker -- 0.50 packs/day    Types: Cigarettes  . Smokeless tobacco: Never Used  . Alcohol Use: No     Comment: no alcohol since jan 2014  . Drug Use: No  . Sexual Activity: None   Other Topics Concern  . None   Social History Narrative  . None   Past Surgical History  Procedure Laterality Date  . Middle ear surgery Bilateral   . Abdominal hysterectomy    . Cholecystectomy    . Appendectomy    . Bilateral breast implants    . Laparoscopic gastric banding    . Gastric bypass    . Tonsillectomy  1962  . Pacemaker placement  1999, 2007,  2010  . Melanoma excision  2005    beneath chin  . Esophagogastroduodenoscopy N/A 09/14/2012    Procedure: ESOPHAGOGASTRODUODENOSCOPY (EGD);  Surgeon: Rachael Fee, MD;  Location: [facility];  Service: Endoscopy;  Laterality: N/A;  . Balloon dilation N/A 09/14/2012    Procedure: BALLOON DILATION;  Surgeon: Rachael Fee, MD;  Location: [facility];  Service: Endoscopy;  Laterality: N/A;   Past Medical History  Diagnosis Date  . HTN (hypertension)   . 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 History of gastroesophageal reflux (GERD)   . Peptic ulcer disease   . Hyperlipidemia   . History of colon polyps   . CAD (coronary artery disease)   . SOB (shortness of breath)   . Cancer 2005    history of melanoma face  . Complication of  anesthesia 2003 and 2004    woke up during colonscopy  . Ankle edema for last 1 week    both ankles  . Osteoporosis   . Osteoporosis, unspecified 09/19/2012   BP 131/82  Pulse 85  Resp 14  Ht 5\' 3"  (1.6 m)  Wt 139 lb 6.4 oz (63.231 kg)  BMI 24.7 kg/m2  SpO2 98%  LMP 04/05/1988  1. Osteoporosis with T8 and T11 compression fractures after fall. Risk factors include history of vestibular problem following meningitis. Has been treated with this fosamax as well as vitamin D and calcium.  Pain medications are gradually tapering. We discussed that minimizing these medicines will also help reduce fall risk and also allow her to resume driving at some point.  In addition, I recommend addressing the balance disorder with vestibular therapy from physical therapy. Will make a referral for this.  We'll need to check urine drug screen prior to starting oxycodone. We will start tapering from 10 mg to 7.5 this month, 150 tablets No alcohol with oxycodone discussed  RTC one month

## 2013-01-22 NOTE — Patient Instructions (Signed)
Do not exceed 5 times per day  no alcohol with oxycodone

## 2013-01-22 NOTE — Telephone Encounter (Signed)
Completed new Prolia Plus form with Supervising MD info and faxed it to [phone]. Awaiting insurance verification.

## 2013-01-26 ENCOUNTER — Telehealth: Payer: Self-pay

## 2013-01-26 NOTE — Telephone Encounter (Signed)
Spoke with pharmacy regarding patients percocet refill.  She is being weaned down.  Pharmacy information clarified.

## 2013-01-26 NOTE — Telephone Encounter (Signed)
Karen at [facility] called regarding patients percocet refill.  Insurance is denying it because it is to early but patient is telling the pharmacy that the medication has been increased.  Please call.

## 2013-02-07 ENCOUNTER — Ambulatory Visit: Payer: Medicare HMO

## 2013-02-15 ENCOUNTER — Telehealth: Payer: Self-pay

## 2013-02-15 NOTE — Telephone Encounter (Signed)
That is ok with these circumstances

## 2013-02-15 NOTE — Telephone Encounter (Signed)
[facility] patient::  Patient called and stated she has a family emergency and has to go to [place] tomorrow night. Her sister is sick with cancer and will be put to sleep on Sunday. The patient will not be coming back until early December. She is not out of her medication yet, but she will be because she is not going to be able to come to her appt this month. She is requesting to pick up her prescription tomorrow.

## 2013-02-16 MED ORDER — OXYCODONE-ACETAMINOPHEN 7.5-325 MG PO TABS: 1.0000 | ORAL_TABLET | Freq: Every day | ORAL | Status: DC | PRN

## 2013-02-16 NOTE — Telephone Encounter (Signed)
Oxycodone script printed.  Informed patient.

## 2013-02-26 ENCOUNTER — Ambulatory Visit: Payer: Medicare HMO | Admitting: Physical Medicine & Rehabilitation

## 2013-03-06 ENCOUNTER — Encounter: Payer: Self-pay | Admitting: *Deleted

## 2013-03-19 ENCOUNTER — Encounter: Payer: Self-pay | Admitting: Internal Medicine

## 2013-03-19 ENCOUNTER — Ambulatory Visit (INDEPENDENT_AMBULATORY_CARE_PROVIDER_SITE_OTHER): Payer: Medicare HMO | Admitting: *Deleted

## 2013-03-19 DIAGNOSIS — R001 Bradycardia, unspecified: Secondary | ICD-10-CM

## 2013-03-19 DIAGNOSIS — I498 Other specified cardiac arrhythmias: Secondary | ICD-10-CM

## 2013-03-19 LAB — MDC_IDC_ENUM_SESS_TYPE_INCLINIC
Battery Impedance: 1470 Ohm
Battery Voltage: 2.75 V
Brady Statistic AP VP Percent: 11 %
Date Time Interrogation Session: 20141215131140
Lead Channel Impedance Value: 524 Ohm
Lead Channel Impedance Value: 686 Ohm
Lead Channel Pacing Threshold Amplitude: 0.75 V
Lead Channel Pacing Threshold Amplitude: 1 V
Lead Channel Pacing Threshold Pulse Width: 0.4 ms
Lead Channel Setting Pacing Amplitude: 2.5 V
Lead Channel Setting Sensing Sensitivity: 2 mV

## 2013-03-19 NOTE — Progress Notes (Signed)
Device check in clinic, all functions normal. Changed Atrial output from 2.25V to 2.0V. Device shows 442 mode switches (<0.1% of time) but some EGMs show far field RV signals. Today's P-waves @ 2.104mV, left Atrial sensitivity @ 0.80mV. Full details in [employer]Art.  ROV w/ Dr. Ladona Ridgel 06/20/13.

## 2013-03-26 ENCOUNTER — Encounter: Payer: Self-pay | Admitting: Cardiology

## 2013-03-26 ENCOUNTER — Ambulatory Visit (HOSPITAL_COMMUNITY): Payer: Medicare HMO | Attending: Cardiovascular Disease

## 2013-03-26 DIAGNOSIS — I6529 Occlusion and stenosis of unspecified carotid artery: Secondary | ICD-10-CM | POA: Insufficient documentation

## 2013-04-03 ENCOUNTER — Ambulatory Visit (HOSPITAL_BASED_OUTPATIENT_CLINIC_OR_DEPARTMENT_OTHER): Payer: Medicare HMO | Admitting: Physical Medicine & Rehabilitation

## 2013-04-03 ENCOUNTER — Encounter: Payer: Self-pay | Admitting: Physical Medicine & Rehabilitation

## 2013-04-03 ENCOUNTER — Encounter: Payer: Medicare HMO | Attending: Physical Medicine & Rehabilitation

## 2013-04-03 VITALS — BP 130/77 | HR 89 | Resp 14 | Ht 63.0 in | Wt 139.4 lb

## 2013-04-03 DIAGNOSIS — M810 Age-related osteoporosis without current pathological fracture: Secondary | ICD-10-CM | POA: Insufficient documentation

## 2013-04-03 DIAGNOSIS — M549 Dorsalgia, unspecified: Secondary | ICD-10-CM | POA: Insufficient documentation

## 2013-04-03 DIAGNOSIS — S22000A Wedge compression fracture of unspecified thoracic vertebra, initial encounter for closed fracture: Secondary | ICD-10-CM

## 2013-04-03 DIAGNOSIS — S22009A Unspecified fracture of unspecified thoracic vertebra, initial encounter for closed fracture: Secondary | ICD-10-CM

## 2013-04-03 DIAGNOSIS — Z87311 Personal history of (healed) other pathological fracture: Secondary | ICD-10-CM | POA: Insufficient documentation

## 2013-04-03 DIAGNOSIS — G8929 Other chronic pain: Secondary | ICD-10-CM

## 2013-04-03 DIAGNOSIS — R269 Unspecified abnormalities of gait and mobility: Secondary | ICD-10-CM | POA: Insufficient documentation

## 2013-04-03 DIAGNOSIS — R279 Unspecified lack of coordination: Secondary | ICD-10-CM | POA: Insufficient documentation

## 2013-04-03 MED ORDER — OXYCODONE HCL 5 MG PO TABS: 5.0000 mg | ORAL_TABLET | ORAL | Status: DC | PRN

## 2013-04-03 NOTE — Patient Instructions (Signed)
New dose take three tablets during the day and 3 tablets at night

## 2013-04-03 NOTE — Progress Notes (Signed)
Subjective:    Patient ID: Linda Lane, female    DOB: Mar 07, 1941, 72 y.o.   MRN: [id number]  HPI Night time pain is the worst Mid back pain and low back pain, constant Some pain behing the R knee worse when getting up  Some exacerbation of pain while in [place], visiting terminally ill sister Pain Inventory Average Pain 8 Pain Right Now 6 My pain is dull and aching  In the last 24 hours, has pain interfered with the following? General activity 3 Relation with others 4 Enjoyment of life 3 What TIME of day is your pain at its worst? evening, night Sleep (in general) Poor  Pain is worse with: walking, sitting and standing Pain improves with: rest and medication Relief from Meds: 7  Mobility walk without assistance how many minutes can you walk? 30 ability to climb steps?  yes do you drive?  yes transfers alone Do you have any goals in this area?  yes  Function retired I need assistance with the following:  household duties and shopping Do you have any goals in this area?  yes  Neuro/Psych No problems in this area  Prior Studies Any changes since last visit?  no  Physicians involved in your care Any changes since last visit?  no   Family History  Problem Relation Age of Onset  . Heart attack Mother   . Stroke Mother   . Ulcers Father   . Stroke Father   . Hypertension Brother   . Hypertension Sister   . Heart disease Maternal Grandmother   . Heart disease Maternal Grandfather   . Colon cancer Neg Hx    History   Social History  . Marital Status: Divorced    Spouse Name: N/A    Number of Children: 2  . Years of Education: N/A   Social History Main Topics  . Smoking status: Current Every Day Smoker -- 0.50 packs/day    Types: Cigarettes  . Smokeless tobacco: Never Used  . Alcohol Use: No     Comment: no alcohol since jan 2014  . Drug Use: No  . Sexual Activity: None   Other Topics Concern  . None   Social History Narrative  . None    Past Surgical History  Procedure Laterality Date  . Middle ear surgery Bilateral   . Abdominal hysterectomy    . Cholecystectomy    . Appendectomy    . Bilateral breast implants    . Laparoscopic gastric banding    . Gastric bypass    . Tonsillectomy  1962  . Pacemaker placement  1999, 2007, 2010  . Melanoma excision  2005    beneath chin  . Esophagogastroduodenoscopy N/A 09/14/2012    Procedure: ESOPHAGOGASTRODUODENOSCOPY (EGD);  Surgeon: Rachael Fee, MD;  Location: [facility];  Service: Endoscopy;  Laterality: N/A;  . Balloon dilation N/A 09/14/2012    Procedure: BALLOON DILATION;  Surgeon: Rachael Fee, MD;  Location: [facility];  Service: Endoscopy;  Laterality: N/A;   Past Medical History  Diagnosis Date  . HTN (hypertension)   . 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 History of gastroesophageal reflux (GERD)   . Peptic ulcer disease   . Hyperlipidemia   . History of colon polyps   . CAD (coronary artery disease)   . SOB (shortness of breath)   . Cancer 2005    history of melanoma face  . Complication of anesthesia 2003 and 2004  woke up during colonscopy  . Ankle edema for last 1 week    both ankles  . Osteoporosis   . Osteoporosis, unspecified 09/19/2012   BP 130/77  Pulse 89  Resp 14  Ht 5\' 3"  (1.6 m)  Wt 139 lb 6.4 oz (63.231 kg)  BMI 24.70 kg/m2  SpO2 100%  LMP 04/05/1988      Review of Systems  Constitutional: Positive for appetite change and unexpected weight change.  Musculoskeletal: Positive for back pain.  All other systems reviewed and are negative.       Objective:   Physical Exam  Nursing note and vitals reviewed.  Constitutional: She is oriented to person, place, and time. She appears well-developed and well-nourished.  HENT:  Head: Normocephalic and atraumatic.  Eyes: Conjunctivae and EOM are normal.  Musculoskeletal:  Thoracic back: She exhibits decreased range of motion. She exhibits CVA L>R  tenderness.  Lumbar back: She exhibits decreased range of motion. She exhibits no tenderness.  -SLR  Neurological: She is alert and oriented to person, place, and time. No sensory deficit.  Psychiatric: She has a normal mood and affect.  Normal strength in the lower extremities.       Assessment & Plan:  1. Osteoporosis with T8 and T11 compression fractures after fall. Risk factors include history of vestibular problem following meningitis. Has been treated with this fosamax as well as vitamin D and calcium.  Pain medications are gradually tapering. We discussed that minimizing these medicines will also help reduce fall risk and also allow her to resume driving at some point.  In addition, I recommend addressing the balance disorder with vestibular therapy from physical therapy.Pt had this in past, she states she can continue her home program exercises.   We'll need to check urine drug screen prior to starting oxycodone. We will continue tapering from  7.5 to 5mg  this month, 180 tablets to allow extra night time dose  RTC one month

## 2013-04-10 ENCOUNTER — Ambulatory Visit (INDEPENDENT_AMBULATORY_CARE_PROVIDER_SITE_OTHER): Payer: Medicare HMO | Admitting: Family

## 2013-04-10 ENCOUNTER — Encounter: Payer: Self-pay | Admitting: Family

## 2013-04-10 VITALS — BP 120/84 | HR 91 | Temp 98.1°F | Resp 16 | Ht 63.25 in | Wt 136.0 lb

## 2013-04-10 DIAGNOSIS — G8929 Other chronic pain: Secondary | ICD-10-CM

## 2013-04-10 DIAGNOSIS — R634 Abnormal weight loss: Secondary | ICD-10-CM

## 2013-04-10 DIAGNOSIS — K2970 Gastritis, unspecified, without bleeding: Secondary | ICD-10-CM

## 2013-04-10 DIAGNOSIS — M546 Pain in thoracic spine: Secondary | ICD-10-CM

## 2013-04-10 DIAGNOSIS — I10 Essential (primary) hypertension: Secondary | ICD-10-CM

## 2013-04-10 DIAGNOSIS — K2990 Gastroduodenitis, unspecified, without bleeding: Secondary | ICD-10-CM

## 2013-04-10 DIAGNOSIS — M549 Dorsalgia, unspecified: Secondary | ICD-10-CM

## 2013-04-10 DIAGNOSIS — Z23 Encounter for immunization: Secondary | ICD-10-CM

## 2013-04-10 LAB — CBC WITH DIFFERENTIAL/PLATELET
BASOS ABS: 0 10*3/uL (ref 0.0–0.1)
BASOS PCT: 0 % (ref 0–1)
Eosinophils Absolute: 0.1 10*3/uL (ref 0.0–0.7)
Eosinophils Relative: 1 % (ref 0–5)
HCT: 38.9 % (ref 36.0–46.0)
Hemoglobin: 13.2 g/dL (ref 12.0–15.0)
Lymphocytes Relative: 34 % (ref 12–46)
Lymphs Abs: 2.8 10*3/uL (ref 0.7–4.0)
MCH: 27.3 pg (ref 26.0–34.0)
MCHC: 33.9 g/dL (ref 30.0–36.0)
MCV: 80.5 fL (ref 78.0–100.0)
Monocytes Absolute: 0.7 10*3/uL (ref 0.1–1.0)
Monocytes Relative: 9 % (ref 3–12)
NEUTROS ABS: 4.5 10*3/uL (ref 1.7–7.7)
NEUTROS PCT: 56 % (ref 43–77)
PLATELETS: 262 10*3/uL (ref 150–400)
RBC: 4.83 MIL/uL (ref 3.87–5.11)
RDW: 15.5 % (ref 11.5–15.5)
WBC: 8.2 10*3/uL (ref 4.0–10.5)

## 2013-04-10 MED ORDER — LISINOPRIL 10 MG PO TABS: 10.0000 mg | ORAL_TABLET | Freq: Every day | ORAL | Status: DC

## 2013-04-10 MED ORDER — ATORVASTATIN CALCIUM 10 MG PO TABS: 10.0000 mg | ORAL_TABLET | Freq: Every evening | ORAL | Status: DC

## 2013-04-10 MED ORDER — LANSOPRAZOLE 30 MG PO CPDR: 30.0000 mg | DELAYED_RELEASE_CAPSULE | Freq: Two times a day (BID) | ORAL | Status: DC

## 2013-04-10 MED ORDER — ATENOLOL 25 MG PO TABS: 25.0000 mg | ORAL_TABLET | Freq: Every morning | ORAL | Status: DC

## 2013-04-10 NOTE — Progress Notes (Signed)
Subjective:    Patient ID: Linda Lane, female    DOB: October 16, 1940, 73 y.o.   MRN: [id number]  HPI  Linda Lane is a 73 yr old female who presents today for follow up.  1) HTN- maintained on atenolol, lisinopril.   BP Readings from Last 3 Encounters:  04/10/13 120/84  04/03/13 130/77  01/22/13 131/82     2) Weight loss-  Wt Readings from Last 3 Encounters:  04/10/13 136 lb (61.689 kg)  04/03/13 139 lb 6.4 oz (63.231 kg)  01/22/13 139 lb 6.4 oz (63.231 kg)   Weight has been relatively stable over the last few months, however her weight back in February 2014 was 157. Reports poor appetite. Continues oxycodone from pain management. Had some associated nausea when she was on the 7.5mg  dose but nausea is now improved.  Notes that her sister died of cancer in 02-Mar-2023. She was with her when she died in [place].  Found the holidays very difficult.   3) She continues to have low back pain.  Uses oxycodone.  She follows with Dr. Letta Pate.    Review of Systems    see HPI  Past Medical History  Diagnosis Date  . HTN (hypertension)   . 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 History of gastroesophageal reflux (GERD)   . Peptic ulcer disease   . Hyperlipidemia   . History of colon polyps   . CAD (coronary artery disease)   . SOB (shortness of breath)   . Cancer 2005    history of melanoma face  . Complication of anesthesia 2003 and 2004    woke up during colonscopy  . Ankle edema for last 1 week    both ankles  . Osteoporosis   . Osteoporosis, unspecified 09/19/2012    History   Social History  . Marital Status: Divorced    Spouse Name: N/A    Number of Children: 2  . Years of Education: N/A   Occupational History  . Not on file.   Social History Main Topics  . Smoking status: Current Every Day Smoker -- 0.50 packs/day    Types: Cigarettes  . Smokeless tobacco: Never Used  . Alcohol Use: No     Comment: no alcohol since jan 2014  . Drug  Use: No  . Sexual Activity: Not on file   Other Topics Concern  . Not on file   Social History Narrative  . No narrative on file    Past Surgical History  Procedure Laterality Date  . Middle ear surgery Bilateral   . Abdominal hysterectomy    . Cholecystectomy    . Appendectomy    . Bilateral breast implants    . Laparoscopic gastric banding    . Gastric bypass    . Tonsillectomy  1962  . Pacemaker placement  1999, 2007, 2010  . Melanoma excision  2005    beneath chin  . Esophagogastroduodenoscopy N/A 09/14/2012    Procedure: ESOPHAGOGASTRODUODENOSCOPY (EGD);  Surgeon: Milus Banister, MD;  Location: [facility];  Service: Endoscopy;  Laterality: N/A;  . Balloon dilation N/A 09/14/2012    Procedure: BALLOON DILATION;  Surgeon: Milus Banister, MD;  Location: [facility];  Service: Endoscopy;  Laterality: N/A;    Family History  Problem Relation Age of Onset  . Heart attack Mother   . Stroke Mother   . Ulcers Father   . Stroke Father   . Hypertension Brother   . Hypertension Sister   .  Heart disease Maternal Grandmother   . Heart disease Maternal Grandfather   . Colon cancer Neg Hx     No Known Allergies  Current Outpatient Prescriptions on File Prior to Visit  Medication Sig Dispense Refill  . aspirin 81 MG tablet Take 81 mg by mouth daily.      . calcium carbonate (OS-CAL) 600 MG TABS Take 500 mg by mouth daily. 2 (two) pills daily      . Cholecalciferol (VITAMIN D-3) 1000 UNITS CAPS Take 1,000 Units by mouth daily.      Marland Kitchen oxyCODONE (OXY IR/ROXICODONE) 5 MG immediate release tablet Take 1 tablet (5 mg total) by mouth every 4 (four) hours as needed for severe pain.  180 tablet  0   No current facility-administered medications on file prior to visit.    BP 120/84  Pulse 91  Temp(Src) 98.1 F (36.7 C) (Oral)  Resp 16  Ht 5' 3.25" (1.607 m)  Wt 136 lb (61.689 kg)  BMI 23.89 kg/m2  SpO2 99%  LMP 04/05/1988    Objective:   Physical Exam  Constitutional:  She is oriented to person, place, and time. She appears well-developed and well-nourished. No distress.  HENT:  Head: Normocephalic and atraumatic.  Cardiovascular: Normal rate and regular rhythm.   No murmur heard. Pulmonary/Chest: Effort normal and breath sounds normal. No respiratory distress. She has no wheezes. She has no rales. She exhibits no tenderness.  Musculoskeletal: She exhibits edema.  Neurological: She is alert and oriented to person, place, and time.  Psychiatric: Her behavior is normal. Judgment and thought content normal.  Became tearful upon discussion of her sister          Assessment & Plan:

## 2013-04-10 NOTE — Progress Notes (Signed)
Pre visit review using our clinic review tool, if applicable. No additional management support is needed unless otherwise documented below in the visit note. 

## 2013-04-10 NOTE — Assessment & Plan Note (Signed)
I think that her weight loss is multifactorial- I think that oxycodone is impacting her appetite as well as her chronic pain, and now recently stress related to sister's death. Will check basic lab work.  If unrevealing and continued weight loss may need to look for more serious cause such as underlying malignancy- we discussed this today.

## 2013-04-10 NOTE — Patient Instructions (Signed)
Please complete lab work prior to leaving. Add ensure- one can twice daily as well as healthy snacks. Follow up in 1 month.

## 2013-04-10 NOTE — Assessment & Plan Note (Signed)
BP stable on current meds, continue same.  Obtain BMET. 

## 2013-04-10 NOTE — Assessment & Plan Note (Signed)
Fair control of pain, defer management to Dr. Letta Pate.

## 2013-04-10 NOTE — Telephone Encounter (Signed)
Forwarded request to [employer] to initiate Prolia verification.

## 2013-04-11 ENCOUNTER — Encounter: Payer: Self-pay | Admitting: Family

## 2013-04-11 LAB — TSH: TSH: 2.781 u[IU]/mL (ref 0.350–4.500)

## 2013-04-11 LAB — BASIC METABOLIC PANEL
BUN: 17 mg/dL (ref 6–23)
CALCIUM: 9.2 mg/dL (ref 8.4–10.5)
CO2: 25 meq/L (ref 19–32)
CREATININE: 0.66 mg/dL (ref 0.50–1.10)
Chloride: 105 mEq/L (ref 96–112)
GLUCOSE: 85 mg/dL (ref 70–99)
Potassium: 4.9 mEq/L (ref 3.5–5.3)
Sodium: 138 mEq/L (ref 135–145)

## 2013-04-13 ENCOUNTER — Telehealth: Payer: Self-pay | Admitting: *Deleted

## 2013-04-13 NOTE — Telephone Encounter (Signed)
Received prior auth request for prevacid due to twice daily dosing.  Form completed and faxed to [facility] at [phone].  Awaiting approval / denial.

## 2013-04-16 NOTE — Telephone Encounter (Signed)
Received denial from [facility]. Decision forwarded to Provider.  Please advise if pt has one of the qualifying diagnoses listed in the determination.

## 2013-04-16 NOTE — Telephone Encounter (Signed)
Patient has hx of erosive GERD- documented esophageal stricture.

## 2013-04-16 NOTE — Telephone Encounter (Signed)
Addended PA form and refaxed to # below.

## 2013-04-16 NOTE — Telephone Encounter (Signed)
Received paperwork from [facility]

## 2013-04-17 ENCOUNTER — Ambulatory Visit: Payer: Medicare HMO | Admitting: Physical Medicine & Rehabilitation

## 2013-04-17 NOTE — Telephone Encounter (Signed)
Received approval from [facility]. Forwarded to [employer]

## 2013-04-18 NOTE — Telephone Encounter (Signed)
Approval good until 04/17/14. Notified Monica in pharmacy.

## 2013-04-20 NOTE — Telephone Encounter (Signed)
Received notice from ins. That Prolia is subject to 20% co-insurance after out of pocket max which has not been met. Ins. Also requires prior auth. Info forwarded to [employer] for assistance.

## 2013-05-01 NOTE — Telephone Encounter (Signed)
Noted, please forward for cosignature.

## 2013-05-01 NOTE — Telephone Encounter (Signed)
Form forwarded to MD, awaiting co-signature.

## 2013-05-01 NOTE — Telephone Encounter (Signed)
Received P.A. Forms with note that they will have to be submitted with Dr Frederik Pear signature and if approved, medication will have to be billed under Dr Charlett Blake as well. Forms forwarded to Provider for completion / signature.

## 2013-05-04 ENCOUNTER — Encounter: Payer: Self-pay | Admitting: Physical Medicine & Rehabilitation

## 2013-05-04 ENCOUNTER — Telehealth: Payer: Self-pay | Admitting: Family

## 2013-05-04 ENCOUNTER — Encounter: Payer: Medicare HMO | Attending: Physical Medicine & Rehabilitation

## 2013-05-04 ENCOUNTER — Ambulatory Visit (HOSPITAL_BASED_OUTPATIENT_CLINIC_OR_DEPARTMENT_OTHER): Payer: Medicare HMO | Admitting: Physical Medicine & Rehabilitation

## 2013-05-04 VITALS — BP 151/80 | HR 88 | Resp 14 | Ht 63.0 in | Wt 140.0 lb

## 2013-05-04 DIAGNOSIS — R269 Unspecified abnormalities of gait and mobility: Secondary | ICD-10-CM | POA: Insufficient documentation

## 2013-05-04 DIAGNOSIS — S22000A Wedge compression fracture of unspecified thoracic vertebra, initial encounter for closed fracture: Secondary | ICD-10-CM

## 2013-05-04 DIAGNOSIS — M549 Dorsalgia, unspecified: Secondary | ICD-10-CM

## 2013-05-04 DIAGNOSIS — G8929 Other chronic pain: Secondary | ICD-10-CM

## 2013-05-04 DIAGNOSIS — R279 Unspecified lack of coordination: Secondary | ICD-10-CM | POA: Insufficient documentation

## 2013-05-04 DIAGNOSIS — M546 Pain in thoracic spine: Principal | ICD-10-CM

## 2013-05-04 DIAGNOSIS — Z87311 Personal history of (healed) other pathological fracture: Secondary | ICD-10-CM | POA: Insufficient documentation

## 2013-05-04 DIAGNOSIS — M810 Age-related osteoporosis without current pathological fracture: Secondary | ICD-10-CM | POA: Insufficient documentation

## 2013-05-04 DIAGNOSIS — S22009A Unspecified fracture of unspecified thoracic vertebra, initial encounter for closed fracture: Secondary | ICD-10-CM

## 2013-05-04 MED ORDER — OXYCODONE HCL 5 MG PO TABS: 5.0000 mg | ORAL_TABLET | ORAL | Status: DC | PRN

## 2013-05-04 NOTE — Progress Notes (Signed)
Subjective:    Patient ID: Linda Lane, female    DOB: 1940-04-29, 73 y.o.   MRN: [id number]  HPI No further falls Still wakes up at night with pain and takes an extra oxycodone. No weakness in the legs No bowel or bladder dysfunction  Pain Inventory Average Pain 7 Pain Right Now 8 My pain is intermittent, dull and aching  In the last 24 hours, has pain interfered with the following? General activity 7 Relation with others 8 Enjoyment of life 9 What TIME of day is your pain at its worst? evening, night Sleep (in general) Poor  Pain is worse with: walking and standing Pain improves with: rest and medication Relief from Meds: 4  Mobility walk without assistance how many minutes can you walk? 20 ability to climb steps?  yes do you drive?  yes transfers alone Do you have any goals in this area?  yes  Function not employed: date last employed na I need assistance with the following:  household duties and shopping Do you have any goals in this area?  yes  Neuro/Psych No problems in this area  Prior Studies Any changes since last visit?  no  Physicians involved in your care Any changes since last visit?  no   Family History  Problem Relation Age of Onset  . Heart attack Mother   . Stroke Mother   . Ulcers Father   . Stroke Father   . Hypertension Brother   . Hypertension Sister   . Heart disease Maternal Grandmother   . Heart disease Maternal Grandfather   . Colon cancer Neg Hx    History   Social History  . Marital Status: Divorced    Spouse Name: N/A    Number of Children: 2  . Years of Education: N/A   Social History Main Topics  . Smoking status: Current Every Day Smoker -- 0.50 packs/day    Types: Cigarettes  . Smokeless tobacco: Never Used  . Alcohol Use: No     Comment: no alcohol since jan 2014  . Drug Use: No  . Sexual Activity: None   Other Topics Concern  . None   Social History Narrative  . None   Past Surgical History    Procedure Laterality Date  . Middle ear surgery Bilateral   . Abdominal hysterectomy    . Cholecystectomy    . Appendectomy    . Bilateral breast implants    . Laparoscopic gastric banding    . Gastric bypass    . Tonsillectomy  1962  . Pacemaker placement  1999, 2007, 2010  . Melanoma excision  2005    beneath chin  . Esophagogastroduodenoscopy N/A 09/14/2012    Procedure: ESOPHAGOGASTRODUODENOSCOPY (EGD);  Surgeon: Milus Banister, MD;  Location: [facility];  Service: Endoscopy;  Laterality: N/A;  . Balloon dilation N/A 09/14/2012    Procedure: BALLOON DILATION;  Surgeon: Milus Banister, MD;  Location: [facility];  Service: Endoscopy;  Laterality: N/A;   Past Medical History  Diagnosis Date  . HTN (hypertension)   . 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 History of gastroesophageal reflux (GERD)   . Peptic ulcer disease   . Hyperlipidemia   . History of colon polyps   . CAD (coronary artery disease)   . SOB (shortness of breath)   . Cancer 2005    history of melanoma face  . Complication of anesthesia 2003 and 2004    woke up during  colonscopy  . Ankle edema for last 1 week    both ankles  . Osteoporosis   . Osteoporosis, unspecified 09/19/2012   BP 151/80  Pulse 88  Resp 14  Ht 5\' 3"  (1.6 m)  Wt 140 lb (63.504 kg)  BMI 24.81 kg/m2  SpO2 99%  LMP 04/05/1988  Opioid Risk Score:   Fall Risk Score:      Review of Systems  Constitutional: Positive for fever, chills, appetite change and unexpected weight change.  Gastrointestinal: Positive for abdominal pain.  Musculoskeletal: Positive for back pain.  All other systems reviewed and are negative.       Objective:   Physical Exam  Nursing note and vitals reviewed.  Constitutional: She is oriented to person, place, and time. She appears well-developed and well-nourished.  HENT:  Head: Normocephalic and atraumatic.  Eyes: Conjunctivae and EOM are normal.  Musculoskeletal:  Thoracic  back: She exhibits decreased range of motion. She exhibits Left  CVA  tenderness.  Lumbar back: She exhibits decreased range of motion. She exhibits no tenderness.  -SLR  Neurological: She is alert and oriented to person, place, and time. No sensory deficit.  Psychiatric: She has a normal mood and affect.  Normal strength in the lower extremities.  Ambulation is with kyphotic posture     Assessment & Plan:  1. Osteoporosis with T8 and T11 compression fractures after fall. Risk factors include history of vestibular problem following meningitis. Has been treated with this fosamax as well as vitamin D and calcium.  Pain medications are gradually tapering. We discussed that minimizing these medicines will also help reduce fall risk and also allow her to resume driving at some point.  In addition, I recommend addressing the balance disorder with vestibular therapy from physical therapy.Pt had this in past, she states she can continue her home program exercises  Continue oxycodone 5 mg 180 tablets per month, overall goal is to reduce number of tablets per month PA followup in one month

## 2013-05-04 NOTE — Patient Instructions (Signed)
Continue current medications ,  Se PA in one moth  If pain starts subsiding may be able to reduce amt of pills per month

## 2013-05-08 ENCOUNTER — Encounter: Payer: Self-pay | Admitting: Family

## 2013-05-08 ENCOUNTER — Ambulatory Visit (INDEPENDENT_AMBULATORY_CARE_PROVIDER_SITE_OTHER): Payer: Medicare HMO | Admitting: Family

## 2013-05-08 VITALS — BP 128/88 | HR 61 | Temp 97.3°F | Resp 16 | Ht 63.25 in | Wt 140.1 lb

## 2013-05-08 DIAGNOSIS — M546 Pain in thoracic spine: Secondary | ICD-10-CM

## 2013-05-08 DIAGNOSIS — M549 Dorsalgia, unspecified: Secondary | ICD-10-CM

## 2013-05-08 DIAGNOSIS — R634 Abnormal weight loss: Secondary | ICD-10-CM

## 2013-05-08 DIAGNOSIS — G8929 Other chronic pain: Secondary | ICD-10-CM

## 2013-05-08 NOTE — Progress Notes (Signed)
Pre visit review using our clinic review tool, if applicable. No additional management support is needed unless otherwise documented below in the visit note. 

## 2013-05-08 NOTE — Assessment & Plan Note (Signed)
Now following with Dr. Letta Pate.  I advised her to touch base with him un

## 2013-05-08 NOTE — Progress Notes (Signed)
Subjective:    Patient ID: Linda Lane, female    DOB: 18-Apr-1940, 73 y.o.   MRN: [id number]  HPI  [name] is a 73 yr old female who presents today for follow up of her weight loss.  Last visit ensure was added to her diet. She reports that she has not been using ensure but has been "baking a lot and eating it myself."    Wt Readings from Last 3 Encounters:  05/08/13 140 lb 1.3 oz (63.54 kg)  05/04/13 140 lb (63.504 kg)  04/10/13 136 lb (61.689 kg)    Body mass index is 24.6 kg/(m^2).  Chronic pain- med with Dr. Letta Pate.  Reports that she can sometimes go 5-6 hours without meds.  Reports that she has some withdrawal symptoms though when she tries to hold off between doses.  "My legs start twitching."   Review of Systems See HPI  Past Medical History  Diagnosis Date  . HTN (hypertension)   . 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 History of gastroesophageal reflux (GERD)   . Peptic ulcer disease   . Hyperlipidemia   . History of colon polyps   . CAD (coronary artery disease)   . SOB (shortness of breath)   . Cancer 2005    history of melanoma face  . Complication of anesthesia 2003 and 2004    woke up during colonscopy  . Ankle edema for last 1 week    both ankles  . Osteoporosis   . Osteoporosis, unspecified 09/19/2012    History   Social History  . Marital Status: Divorced    Spouse Name: N/A    Number of Children: 2  . Years of Education: N/A   Occupational History  . Not on file.   Social History Main Topics  . Smoking status: Current Every Day Smoker -- 0.50 packs/day    Types: Cigarettes  . Smokeless tobacco: Never Used  . Alcohol Use: No     Comment: no alcohol since jan 2014  . Drug Use: No  . Sexual Activity: Not on file   Other Topics Concern  . Not on file   Social History Narrative  . No narrative on file    Past Surgical History  Procedure Laterality Date  . Middle ear surgery Bilateral   . Abdominal  hysterectomy    . Cholecystectomy    . Appendectomy    . Bilateral breast implants    . Laparoscopic gastric banding    . Gastric bypass    . Tonsillectomy  1962  . Pacemaker placement  1999, 2007, 2010  . Melanoma excision  2005    beneath chin  . Esophagogastroduodenoscopy N/A 09/14/2012    Procedure: ESOPHAGOGASTRODUODENOSCOPY (EGD);  Surgeon: Milus Banister, MD;  Location: [facility];  Service: Endoscopy;  Laterality: N/A;  . Balloon dilation N/A 09/14/2012    Procedure: BALLOON DILATION;  Surgeon: Milus Banister, MD;  Location: [facility];  Service: Endoscopy;  Laterality: N/A;    Family History  Problem Relation Age of Onset  . Heart attack Mother   . Stroke Mother   . Ulcers Father   . Stroke Father   . Hypertension Brother   . Hypertension Sister   . Heart disease Maternal Grandmother   . Heart disease Maternal Grandfather   . Colon cancer Neg Hx     No Known Allergies  Current Outpatient Prescriptions on File Prior to Visit  Medication Sig Dispense Refill  .  aspirin 81 MG tablet Take 81 mg by mouth daily.      Marland Kitchen atenolol (TENORMIN) 25 MG tablet Take 1 tablet (25 mg total) by mouth every morning.  90 tablet  1  . atorvastatin (LIPITOR) 10 MG tablet Take 1 tablet (10 mg total) by mouth every evening.  90 tablet  1  . calcium carbonate (OS-CAL) 600 MG TABS Take 500 mg by mouth daily. 2 (two) pills daily      . Cholecalciferol (VITAMIN D-3) 1000 UNITS CAPS Take 1,000 Units by mouth daily.      . lansoprazole (PREVACID) 30 MG capsule Take 1 capsule (30 mg total) by mouth 2 (two) times daily.  180 capsule  1  . lisinopril (PRINIVIL,ZESTRIL) 10 MG tablet Take 1 tablet (10 mg total) by mouth daily.  90 tablet  1  . oxyCODONE (OXY IR/ROXICODONE) 5 MG immediate release tablet Take 1 tablet (5 mg total) by mouth every 4 (four) hours as needed for severe pain.  180 tablet  0   No current facility-administered medications on file prior to visit.    BP 128/88  Pulse 61   Temp(Src) 97.3 F (36.3 C) (Oral)  Resp 16  Ht 5' 3.25" (1.607 m)  Wt 140 lb 1.3 oz (63.54 kg)  BMI 24.60 kg/m2  SpO2 100%  LMP 04/05/1988       Objective:   Physical Exam  Constitutional: She is oriented to person, place, and time. She appears well-developed and well-nourished. No distress.  HENT:  Head: Normocephalic and atraumatic.  Cardiovascular: Normal rate and regular rhythm.   No murmur heard. Pulmonary/Chest: Effort normal and breath sounds normal. No respiratory distress. She has no wheezes. She has no rales. She exhibits no tenderness.  Musculoskeletal: She exhibits no edema.  Neurological: She is alert and oriented to person, place, and time.  Psychiatric: She has a normal mood and affect. Her behavior is normal. Judgment and thought content normal.          Assessment & Plan:

## 2013-05-08 NOTE — Patient Instructions (Signed)
Please follow up in 6 months, sooner if problems/concerns.  

## 2013-05-08 NOTE — Assessment & Plan Note (Signed)
Weigh has stabilized. I advised her to try to maintain her weight at this point.

## 2013-05-09 NOTE — Telephone Encounter (Signed)
Relevant patient education mailed to patient.  

## 2013-05-16 ENCOUNTER — Telehealth: Payer: Self-pay | Admitting: Family

## 2013-05-16 NOTE — Telephone Encounter (Signed)
Ruby called to advise the prolia for this patient has been approved from 05-03-2013 through 05-04-2015

## 2013-05-25 ENCOUNTER — Other Ambulatory Visit: Payer: Self-pay | Admitting: *Deleted

## 2013-05-25 MED ORDER — OXYCODONE HCL 5 MG PO TABS: 5.0000 mg | ORAL_TABLET | ORAL | Status: DC | PRN

## 2013-05-25 NOTE — Telephone Encounter (Signed)
RX printed early for controlled medication for the visit with RN on 05/29/13 (to be signed by MD) 

## 2013-05-29 ENCOUNTER — Encounter: Payer: Self-pay | Admitting: *Deleted

## 2013-05-29 ENCOUNTER — Encounter: Payer: Medicare HMO | Attending: Physical Medicine & Rehabilitation | Admitting: *Deleted

## 2013-05-29 VITALS — BP 159/81 | HR 115 | Resp 14 | Wt 139.0 lb

## 2013-05-29 DIAGNOSIS — M810 Age-related osteoporosis without current pathological fracture: Secondary | ICD-10-CM | POA: Insufficient documentation

## 2013-05-29 DIAGNOSIS — M549 Dorsalgia, unspecified: Secondary | ICD-10-CM | POA: Insufficient documentation

## 2013-05-29 DIAGNOSIS — G8929 Other chronic pain: Secondary | ICD-10-CM | POA: Insufficient documentation

## 2013-05-29 DIAGNOSIS — Z79899 Other long term (current) drug therapy: Secondary | ICD-10-CM | POA: Insufficient documentation

## 2013-05-29 DIAGNOSIS — M546 Pain in thoracic spine: Secondary | ICD-10-CM

## 2013-05-29 NOTE — Progress Notes (Signed)
Here for pill count and medication refills. Did not bring bottle today.  Reminded it must be brought to every visit even if it is empty.  She will have to go get it if not with her next visit.  VSS    Pain level:9  Does not feel like the 5 mg is strong enough.  I told her we cannot change today but will schedule with Dr Letta Pate next month to discuss.  Has had no falls.  She has her pathways clear and has read and been given the fall prevention handout for the home.

## 2013-05-29 NOTE — Telephone Encounter (Signed)
Received fax from [facility] that [place] has been approved from 05/03/13 through 05/04/15.  Notified pt and scheduled nurse visit for prolia injection for 06/05/13 at 2pm.

## 2013-05-29 NOTE — Patient Instructions (Addendum)
Follow up one month with Dr Aretta Nip to discuss meds

## 2013-06-05 ENCOUNTER — Ambulatory Visit (INDEPENDENT_AMBULATORY_CARE_PROVIDER_SITE_OTHER): Payer: Medicare HMO | Admitting: Family

## 2013-06-05 DIAGNOSIS — M810 Age-related osteoporosis without current pathological fracture: Secondary | ICD-10-CM

## 2013-06-05 MED ORDER — DENOSUMAB 60 MG/ML ~~LOC~~ SOLN
60.0000 mg | Freq: Once | SUBCUTANEOUS | Status: AC
  Administered 2013-06-05: 60 mg via SUBCUTANEOUS

## 2013-06-07 ENCOUNTER — Ambulatory Visit: Payer: Medicare HMO | Admitting: Physical Medicine and Rehabilitation

## 2013-06-13 ENCOUNTER — Telehealth: Payer: Self-pay

## 2013-06-13 NOTE — Telephone Encounter (Signed)
Spoke with patient regarding her back pain.  She is not sure if the prolia has caused it to worsen.  She had the prolia injection about 2 weeks ago and the back pain increased this weekend.  Patient did have to carry some bags in from the grocery store but no other changes.  Advised patient to rotate heat and ice.  Told her she could also try OTC muscle cream or patches.  Offered patient an appointment.  She denied appointment at this time and will call back if she has any other problems.

## 2013-06-13 NOTE — Telephone Encounter (Signed)
Patient called requesting something stonger for her back pain.  She says that her pain has gotten worse since the prolia injection?  Patient is currently taking 7.5 of her oxycodone.  Please advise.

## 2013-06-18 ENCOUNTER — Telehealth: Payer: Self-pay | Admitting: Physical Medicine & Rehabilitation

## 2013-06-18 NOTE — Telephone Encounter (Signed)
May bridge until next vist at current dose

## 2013-06-18 NOTE — Telephone Encounter (Signed)
Pt called in to say she will be out of medication on Monday... The medication is oxyCODONE (OXY IR/ROXICODONE) 5 MG immediate release tablet ...she states she still want to see Dr. Raliegh Ip on 03.26.2015 to discuss medication change; she has been taking her meds a little different from the last visit; pt is willing to come in and pick up a prescription.  Pt's number is [phone]

## 2013-06-19 NOTE — Telephone Encounter (Signed)
Please advise directions for bridge.

## 2013-06-19 NOTE — Telephone Encounter (Signed)
Oxycodone 5mg  #30 1 po q4h prn until my appt

## 2013-06-20 ENCOUNTER — Ambulatory Visit (INDEPENDENT_AMBULATORY_CARE_PROVIDER_SITE_OTHER): Payer: Commercial Managed Care - HMO | Admitting: Internal Medicine

## 2013-06-20 ENCOUNTER — Encounter: Payer: Self-pay | Admitting: Internal Medicine

## 2013-06-20 VITALS — BP 163/98 | HR 84 | Ht 63.0 in | Wt 141.0 lb

## 2013-06-20 DIAGNOSIS — Z950 Presence of cardiac pacemaker: Secondary | ICD-10-CM | POA: Diagnosis not present

## 2013-06-20 DIAGNOSIS — I498 Other specified cardiac arrhythmias: Secondary | ICD-10-CM | POA: Diagnosis not present

## 2013-06-20 DIAGNOSIS — I10 Essential (primary) hypertension: Secondary | ICD-10-CM | POA: Diagnosis not present

## 2013-06-20 DIAGNOSIS — R001 Bradycardia, unspecified: Secondary | ICD-10-CM

## 2013-06-20 LAB — MDC_IDC_ENUM_SESS_TYPE_INCLINIC
Battery Remaining Longevity: 34 mo
Battery Voltage: 2.76 V
Brady Statistic AP VS Percent: 59 %
Brady Statistic AS VP Percent: 5 %
Brady Statistic AS VS Percent: 16 %
Date Time Interrogation Session: 20150318184037
Lead Channel Impedance Value: 544 Ohm
Lead Channel Pacing Threshold Amplitude: 0.75 V
Lead Channel Pacing Threshold Amplitude: 1 V
Lead Channel Pacing Threshold Pulse Width: 0.4 ms
Lead Channel Pacing Threshold Pulse Width: 0.4 ms
Lead Channel Sensing Intrinsic Amplitude: 2.8 mV
Lead Channel Setting Pacing Amplitude: 2.5 V
Lead Channel Setting Pacing Pulse Width: 0.4 ms
Lead Channel Setting Sensing Sensitivity: 2 mV
MDC IDC MSMT BATTERY IMPEDANCE: 1729 Ohm
MDC IDC MSMT LEADCHNL RA IMPEDANCE VALUE: 652 Ohm
MDC IDC MSMT LEADCHNL RA SENSING INTR AMPL: 1 mV
MDC IDC SET LEADCHNL RA PACING AMPLITUDE: 2 V
MDC IDC STAT BRADY AP VP PERCENT: 19 %

## 2013-06-20 MED ORDER — OXYCODONE HCL 5 MG PO TABS: 5.0000 mg | ORAL_TABLET | ORAL | Status: DC | PRN

## 2013-06-20 NOTE — Assessment & Plan Note (Signed)
Her blood pressure is elevated today. She is encouraged to reduce her salt intake.

## 2013-06-20 NOTE — Telephone Encounter (Signed)
Patient called and wanted to know when her refill would be ready for pickup. I informed patient it would be Thursday afternoon because Dr. Letta Pate was out of the clinic today.

## 2013-06-20 NOTE — Patient Instructions (Signed)

## 2013-06-20 NOTE — Progress Notes (Signed)
HPI [name] returns today for ongoing evaluation and management of her permanent PM. 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 Since then, she has had no recurrent syncope. She denies palpitations, chest pain, or shortness of breath. She does admit to some sodium indiscretion. She did fall and break her back and is recovering. No Known Allergies   Current Outpatient Prescriptions  Medication Sig Dispense Refill  . aspirin 81 MG tablet Take 81 mg by mouth daily.      Marland Kitchen atenolol (TENORMIN) 25 MG tablet Take 1 tablet (25 mg total) by mouth every morning.  90 tablet  1  . atorvastatin (LIPITOR) 10 MG tablet Take 1 tablet (10 mg total) by mouth every evening.  90 tablet  1  . calcium carbonate (OS-CAL) 600 MG TABS Take 500 mg by mouth daily.       . Cholecalciferol (VITAMIN D-3) 1000 UNITS CAPS Take 1,000 Units by mouth daily.      . lansoprazole (PREVACID) 30 MG capsule Take 1 capsule (30 mg total) by mouth 2 (two) times daily.  180 capsule  1  . lisinopril (PRINIVIL,ZESTRIL) 10 MG tablet Take 1 tablet (10 mg total) by mouth daily.  90 tablet  1  . oxyCODONE (OXY IR/ROXICODONE) 5 MG immediate release tablet Take 1 tablet (5 mg total) by mouth every 4 (four) hours as needed for severe pain.  30 tablet  0   No current facility-administered medications for this visit.     Past Medical History  Diagnosis Date  . HTN (hypertension)   . 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 History of gastroesophageal reflux (GERD)   . Peptic ulcer disease   . Hyperlipidemia   . History of colon polyps   . CAD (coronary artery disease)   . SOB (shortness of breath)   . Cancer 2005    history of melanoma face  . Complication of anesthesia 2003 and 2004    woke up during colonscopy  . Ankle edema for last 1 week    both ankles  . Osteoporosis   . Osteoporosis, unspecified 09/19/2012     ROS:   All systems reviewed and negative except as noted in the HPI.   Past Surgical History  Procedure Laterality Date  . Middle ear surgery Bilateral   . Abdominal hysterectomy    . Cholecystectomy    . Appendectomy    . Bilateral breast implants    . Laparoscopic gastric banding    . Gastric bypass    . Tonsillectomy  1962  . Pacemaker placement  1999, 2007, 2010  . Melanoma excision  2005    beneath chin  . Esophagogastroduodenoscopy N/A 09/14/2012    Procedure: ESOPHAGOGASTRODUODENOSCOPY (EGD);  Surgeon: Milus Banister, MD;  Location: [facility];  Service: Endoscopy;  Laterality: N/A;  . Balloon dilation N/A 09/14/2012    Procedure: BALLOON DILATION;  Surgeon: Milus Banister, MD;  Location: [facility];  Service: Endoscopy;  Laterality: N/A;     Family History  Problem Relation Age of Onset  . Heart attack Mother   . Stroke Mother   . Ulcers Father   . Stroke Father   . Hypertension Brother   . Hypertension Sister   . Heart disease Maternal Grandmother   . Heart disease Maternal Grandfather   . Colon cancer Neg Hx      History  Social History  . Marital Status: Divorced    Spouse Name: N/A    Number of Children: 2  . Years of Education: N/A   Occupational History  . Not on file.   Social History Main Topics  . Smoking status: Current Every Day Smoker -- 0.50 packs/day    Types: Cigarettes  . Smokeless tobacco: Never Used  . Alcohol Use: No     Comment: no alcohol since jan 2014  . Drug Use: No  . Sexual Activity: Not on file   Other Topics Concern  . Not on file   Social History Narrative  . No narrative on file     BP 163/98  Pulse 84  Ht 5\' 3"  (1.6 m)  Wt 141 lb (63.957 kg)  BMI 24.98 kg/m2  LMP 04/05/1988  Physical Exam:  Well appearing 73 year old woman, NAD HEENT: Unremarkable Neck:  No JVD, no thyromegally Lungs:  Clear with no wheezes, rales, or rhonchi. HEART:  Regular rate rhythm, no murmurs, no rubs, no  clicks Abd:  soft, positive bowel sounds, no organomegally, no rebound, no guarding Ext:  2 plus pulses, no edema, no cyanosis, no clubbing Skin:  No rashes no nodules Neuro:  CN II through XII intact, motor grossly intact  DEVICE  Normal device function.  See PaceArt for details.   Assess/Plan:

## 2013-06-20 NOTE — Telephone Encounter (Signed)
RX printed for Dr. Letta Pate to sign. Will contact patient when ready for pickup.

## 2013-06-20 NOTE — Assessment & Plan Note (Signed)
Her [employer] DDD PM is working normally. Will recheck in several months. 

## 2013-06-21 ENCOUNTER — Other Ambulatory Visit: Payer: Self-pay | Admitting: *Deleted

## 2013-06-21 MED ORDER — OXYCODONE HCL 5 MG PO TABS: 5.0000 mg | ORAL_TABLET | ORAL | Status: DC | PRN

## 2013-06-21 NOTE — Telephone Encounter (Signed)
rx reprinted so that [place] PA could sign rx.

## 2013-06-28 ENCOUNTER — Ambulatory Visit (HOSPITAL_BASED_OUTPATIENT_CLINIC_OR_DEPARTMENT_OTHER): Payer: Medicare HMO | Admitting: Physical Medicine & Rehabilitation

## 2013-06-28 ENCOUNTER — Ambulatory Visit (HOSPITAL_BASED_OUTPATIENT_CLINIC_OR_DEPARTMENT_OTHER)
Admission: RE | Admit: 2013-06-28 | Discharge: 2013-06-28 | Disposition: A | Discharge status: Home / Self Care | Payer: Medicare HMO | Source: Ambulatory Visit | Attending: Physical Medicine & Rehabilitation | Admitting: Physical Medicine & Rehabilitation

## 2013-06-28 ENCOUNTER — Encounter: Payer: Medicare HMO | Attending: Physical Medicine & Rehabilitation

## 2013-06-28 ENCOUNTER — Encounter: Payer: Self-pay | Admitting: Physical Medicine & Rehabilitation

## 2013-06-28 VITALS — BP 144/87 | HR 82 | Resp 14 | Ht 63.0 in | Wt 139.0 lb

## 2013-06-28 DIAGNOSIS — M549 Dorsalgia, unspecified: Secondary | ICD-10-CM | POA: Insufficient documentation

## 2013-06-28 DIAGNOSIS — M948X9 Other specified disorders of cartilage, unspecified sites: Secondary | ICD-10-CM

## 2013-06-28 DIAGNOSIS — M545 Low back pain, unspecified: Secondary | ICD-10-CM | POA: Insufficient documentation

## 2013-06-28 DIAGNOSIS — F1120 Opioid dependence, uncomplicated: Secondary | ICD-10-CM

## 2013-06-28 DIAGNOSIS — Z87311 Personal history of (healed) other pathological fracture: Secondary | ICD-10-CM | POA: Insufficient documentation

## 2013-06-28 DIAGNOSIS — R279 Unspecified lack of coordination: Secondary | ICD-10-CM | POA: Insufficient documentation

## 2013-06-28 DIAGNOSIS — F1920 Other psychoactive substance dependence, uncomplicated: Secondary | ICD-10-CM

## 2013-06-28 DIAGNOSIS — M810 Age-related osteoporosis without current pathological fracture: Secondary | ICD-10-CM | POA: Insufficient documentation

## 2013-06-28 DIAGNOSIS — R269 Unspecified abnormalities of gait and mobility: Secondary | ICD-10-CM | POA: Insufficient documentation

## 2013-06-28 DIAGNOSIS — M546 Pain in thoracic spine: Secondary | ICD-10-CM | POA: Insufficient documentation

## 2013-06-28 DIAGNOSIS — G8929 Other chronic pain: Secondary | ICD-10-CM

## 2013-06-28 MED ORDER — OXYCODONE-ACETAMINOPHEN 7.5-325 MG PO TABS: 1.0000 | ORAL_TABLET | ORAL | Status: DC | PRN

## 2013-06-28 NOTE — Progress Notes (Signed)
Subjective:    Patient ID: Linda Lane, female    DOB: Oct 28, 1940, 73 y.o.   MRN: [id number]  HPI Complaining of some back pain but also difficulty when reducing dose of oxycodone with withdrawal type symptoms. Says she has cramping in the abdomen nausea and sweating. Discussed that compression fractures should be healed by now. No further falling, no new areas of pain No weakness  Pain Inventory Average Pain 7 Pain Right Now 8 My pain is dull and aching  In the last 24 hours, has pain interfered with the following? General activity 6 Relation with others 5 Enjoyment of life 4 What TIME of day is your pain at its worst? evening Sleep (in general) Poor  Pain is worse with: walking and standing Pain improves with: medication Relief from Meds: 7  Mobility walk without assistance how many minutes can you walk? 20 ability to climb steps?  yes do you drive?  yes  Function I need assistance with the following:  household duties and shopping  Neuro/Psych No problems in this area  Prior Studies Any changes since last visit?  no  Physicians involved in your care Any changes since last visit?  no   Family History  Problem Relation Age of Onset  . Heart attack Mother   . Stroke Mother   . Ulcers Father   . Stroke Father   . Hypertension Brother   . Hypertension Sister   . Heart disease Maternal Grandmother   . Heart disease Maternal Grandfather   . Colon cancer Neg Hx    History   Social History  . Marital Status: Divorced    Spouse Name: N/A    Number of Children: 2  . Years of Education: N/A   Social History Main Topics  . Smoking status: Current Every Day Smoker -- 0.50 packs/day    Types: Cigarettes  . Smokeless tobacco: Never Used  . Alcohol Use: No     Comment: no alcohol since jan 2014  . Drug Use: No  . Sexual Activity: None   Other Topics Concern  . None   Social History Narrative  . None   Past Surgical History  Procedure Laterality  Date  . Middle ear surgery Bilateral   . Abdominal hysterectomy    . Cholecystectomy    . Appendectomy    . Bilateral breast implants    . Laparoscopic gastric banding    . Gastric bypass    . Tonsillectomy  1962  . Pacemaker placement  1999, 2007, 2010  . Melanoma excision  2005    beneath chin  . Esophagogastroduodenoscopy N/A 09/14/2012    Procedure: ESOPHAGOGASTRODUODENOSCOPY (EGD);  Surgeon: Milus Banister, MD;  Location: [facility];  Service: Endoscopy;  Laterality: N/A;  . Balloon dilation N/A 09/14/2012    Procedure: BALLOON DILATION;  Surgeon: Milus Banister, MD;  Location: [facility];  Service: Endoscopy;  Laterality: N/A;   Past Medical History  Diagnosis Date  . HTN (hypertension)   . 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 History of gastroesophageal reflux (GERD)   . Peptic ulcer disease   . Hyperlipidemia   . History of colon polyps   . CAD (coronary artery disease)   . SOB (shortness of breath)   . Cancer 2005    history of melanoma face  . Complication of anesthesia 2003 and 2004    woke up during colonscopy  . Ankle edema for last 1 week  both ankles  . Osteoporosis   . Osteoporosis, unspecified 09/19/2012   BP 144/87  Pulse 82  Resp 14  Ht 5\' 3"  (1.6 m)  Wt 139 lb (63.05 kg)  BMI 24.63 kg/m2  SpO2 98%  LMP 04/05/1988  Opioid Risk Score:   Fall Risk Score: Moderate Fall Risk (6-13 points) (patient educated handout declined)   Review of Systems  Musculoskeletal: Positive for arthralgias and myalgias.  All other systems reviewed and are negative.       Objective:   Physical Exam Kyphosis of the thoracic spine General no acute distress Mood and affect are appropriate Motor strength is normal in the lower extremities No pain to palpation in the thoracic or lumbar paraspinals No pain with lumbar or thoracic range of motion 50% range of motion in the thoracic and lumbar spine       Assessment & Plan:  1. History of  T8 and T11 compression fractures after fall which was greater than 1 year ago. At this point should be off of pain medicines. Having difficulty with weaning. We'll make referral to prefer pain management Dr. Lynnda Shields to help with this   X-ray of the thoracic and lumbar spine to see if there is any osteoporotic, non traumatic fractures that can explain her difficulty with continued pain. Patient does state that she thinks her main problem is with the pain medicines themselves however.  1 month supply of Percocet 7.5 4 times a day written. This should last her until she can get in with Addiction medicine specialist

## 2013-06-28 NOTE — Patient Instructions (Signed)
[facility] [address] [place] [address] [phone] 

## 2013-06-29 ENCOUNTER — Ambulatory Visit (INDEPENDENT_AMBULATORY_CARE_PROVIDER_SITE_OTHER): Payer: Commercial Managed Care - HMO | Admitting: Cardiovascular Disease

## 2013-06-29 ENCOUNTER — Encounter: Payer: Self-pay | Admitting: Cardiovascular Disease

## 2013-06-29 VITALS — BP 148/108 | HR 86 | Ht 63.0 in | Wt 140.0 lb

## 2013-06-29 DIAGNOSIS — Z950 Presence of cardiac pacemaker: Secondary | ICD-10-CM

## 2013-06-29 DIAGNOSIS — I10 Essential (primary) hypertension: Secondary | ICD-10-CM

## 2013-06-29 DIAGNOSIS — E785 Hyperlipidemia, unspecified: Secondary | ICD-10-CM

## 2013-06-29 MED ORDER — AMLODIPINE BESYLATE 5 MG PO TABS: 5.0000 mg | ORAL_TABLET | Freq: Every day | ORAL | Status: DC

## 2013-06-29 NOTE — Patient Instructions (Signed)
Your physician has recommended you make the following change in your medication:  START Amlodipine 5 mg daily  Your physician recommends that you schedule a follow-up appointment in: a few weeks with your Family Doctor  Your physician wants you to follow-up in: 1 year with Dr. Burt Knack. You will receive a reminder letter in the mail two months in advance. If you don't receive a letter, please call our office to schedule the follow-up appointment.

## 2013-06-29 NOTE — Progress Notes (Signed)
    HPI:  73 year old woman presenting for followup evaluation. She's followed for HTN, diastolic dysfunction, mild carotid stenosis, and bradycardia s/p PPM placement. She recently saw Dr Lovena Le for pacemaker followup.  She's been going through a difficult time. Her sister died in [place] from head and neck CA and she underwent euthanasia. 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 She's been trying to wean off through the pain management clinic but is not having success. Complains of difficult with sleep and headaches.  Doing ok from cardiac perspective. Denies chest pain, dyspnea, edema, palpitations.     Outpatient Encounter Prescriptions as of 06/29/2013  Medication Sig  . aspirin 81 MG tablet Take 81 mg by mouth daily.  Marland Kitchen atenolol (TENORMIN) 25 MG tablet Take 1 tablet (25 mg total) by mouth every morning.  Marland Kitchen atorvastatin (LIPITOR) 10 MG tablet Take 1 tablet (10 mg total) by mouth every evening.  . calcium carbonate (OS-CAL) 600 MG TABS Take 500 mg by mouth daily.   . Cholecalciferol (VITAMIN D-3) 1000 UNITS CAPS Take 1,000 Units by mouth daily.  . lansoprazole (PREVACID) 30 MG capsule Take 1 capsule (30 mg total) by mouth 2 (two) times daily.  Marland Kitchen lisinopril (PRINIVIL,ZESTRIL) 10 MG tablet Take 1 tablet (10 mg total) by mouth daily.  Marland Kitchen oxyCODONE (OXY IR/ROXICODONE) 5 MG immediate release tablet Take 1 tablet (5 mg total) by mouth every 4 (four) hours as needed for severe pain.  Marland Kitchen oxyCODONE-acetaminophen (PERCOCET) 7.5-325 MG per tablet Take 1 tablet by mouth every 4 (four) hours as needed for pain.    No Known Allergies  Past Medical History  Diagnosis Date  . HTN (hypertension)   . 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 History of gastroesophageal reflux (GERD)   . Peptic ulcer disease   . Hyperlipidemia   . History of colon polyps   . CAD (coronary artery disease)   . SOB (shortness of breath)   . Cancer 2005      history of melanoma face  . Complication of anesthesia 2003 and 2004    woke up during colonscopy  . Ankle edema for last 1 week    both ankles  . Osteoporosis   . Osteoporosis, unspecified 09/19/2012   ROS: Negative except as per HPI  LMP 04/05/1988  PHYSICAL EXAM: Pt is alert and oriented, NAD, tearful during discussion HEENT: normal Neck: JVP - normal, carotids 2+= without bruits Lungs: CTA bilaterally CV: RRR without murmur or gallop Abd: soft, NT, Positive BS, no hepatomegaly Ext: no C/C/E, distal pulses intact and equal Skin: warm/dry no rash  EKG:  AV sequential pacing 86 bpm  ASSESSMENT AND PLAN: 1. Coronary artery disease, native vessel. The patient remains stable without anginal symptoms. She will remain on aspirin 81 mg, beta blocker, and statin drug.   2. Essential hypertension. BP is elevated. Suspect partially related to stress over sister's death, pain management issues, etc. However, BP is significantly elevated today. Start amlodipine 5 mg and continue other antihypertensives. Follow-up with PCP.   I will see her back in 12 months.  Sherren Mocha 06/29/2013 3:52 PM

## 2013-06-30 ENCOUNTER — Telehealth: Payer: Self-pay | Admitting: Family

## 2013-06-30 NOTE — Telephone Encounter (Signed)
Please call pt and arrange a 2 week follow up so we can recheck her blood pressure.

## 2013-07-02 NOTE — Telephone Encounter (Signed)
Left detailed message informing patient to call our office to schedule hospital follow up.

## 2013-07-02 NOTE — Telephone Encounter (Signed)
**  not a hospital follow up**  Patient scheduled 2 week follow up for 07/13/13

## 2013-07-13 ENCOUNTER — Ambulatory Visit (INDEPENDENT_AMBULATORY_CARE_PROVIDER_SITE_OTHER): Payer: Commercial Managed Care - HMO | Admitting: Family

## 2013-07-13 ENCOUNTER — Encounter: Payer: Self-pay | Admitting: Family

## 2013-07-13 VITALS — BP 136/86 | HR 68 | Temp 98.2°F | Resp 16 | Ht 63.25 in | Wt 140.1 lb

## 2013-07-13 DIAGNOSIS — I10 Essential (primary) hypertension: Secondary | ICD-10-CM

## 2013-07-13 DIAGNOSIS — M810 Age-related osteoporosis without current pathological fracture: Secondary | ICD-10-CM

## 2013-07-13 DIAGNOSIS — R634 Abnormal weight loss: Secondary | ICD-10-CM

## 2013-07-13 NOTE — Assessment & Plan Note (Signed)
BP is improved on amlodipine. Continue same.

## 2013-07-13 NOTE — Assessment & Plan Note (Signed)
Due for next prolia injection 9/4.

## 2013-07-13 NOTE — Assessment & Plan Note (Signed)
Wt Readings from Last 3 Encounters:  07/13/13 140 lb 1.9 oz (63.558 kg)  06/29/13 140 lb (63.504 kg)  06/28/13 139 lb (63.05 kg)  weight stable.

## 2013-07-13 NOTE — Patient Instructions (Addendum)
[facility] number- [phone] Please call them re: consult appointment.  Continue amlodipine. Please schedule a follow up appointment in 3 months.

## 2013-07-13 NOTE — Progress Notes (Signed)
Pre visit review using our clinic review tool, if applicable. No additional management support is needed unless otherwise documented below in the visit note. 

## 2013-07-13 NOTE — Progress Notes (Signed)
Subjective:    Patient ID: Linda Lane, female    DOB: February 21, 1941, 73 y.o.   MRN: [id number]  HPI  Linda Lane is a 73 yr old female who presents today for follow up.  1) HTN- she saw Dr. Burt Knack on 3/27 and he started her on amlodipine 5mg . BP Readings from Last 3 Encounters:  07/13/13 136/86  06/29/13 148/108  06/28/13 144/87   2) Back Pain-   She was placed on oxycodone 7.5mg  4 times daily. Was unsuccessful in weaning off due to pain and withdrawal symptoms. Dr. Letta Pate has [facility] a [facility] and she tells me that she has not yet heard back from the pain clinic.    4) Oteoporosis- She is maintained on Prolia- due for follow up on 9/4.    Review of Systems    see HPI  Past Medical History  Diagnosis Date  . HTN (hypertension)   . 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 History of gastroesophageal reflux (GERD)   . Peptic ulcer disease   . Hyperlipidemia   . History of colon polyps   . CAD (coronary artery disease)   . SOB (shortness of breath)   . Cancer 2005    history of melanoma face  . Complication of anesthesia 2003 and 2004    woke up during colonscopy  . Ankle edema for last 1 week    both ankles  . Osteoporosis   . Osteoporosis, unspecified 09/19/2012    History   Social History  . Marital Status: Divorced    Spouse Name: N/A    Number of Children: 2  . Years of Education: N/A   Occupational History  . Not on file.   Social History Main Topics  . Smoking status: Current Every Day Smoker -- 0.50 packs/day    Types: Cigarettes  . Smokeless tobacco: Never Used  . Alcohol Use: No     Comment: no alcohol since jan 2014  . Drug Use: No  . Sexual Activity: Not on file   Other Topics Concern  . Not on file   Social History Narrative  . No narrative on file    Past Surgical History  Procedure Laterality Date  . Middle ear surgery Bilateral   . Abdominal hysterectomy    . Cholecystectomy    .  Appendectomy    . Bilateral breast implants    . Laparoscopic gastric banding    . Gastric bypass    . Tonsillectomy  1962  . Pacemaker placement  1999, 2007, 2010  . Melanoma excision  2005    beneath chin  . Esophagogastroduodenoscopy N/A 09/14/2012    Procedure: ESOPHAGOGASTRODUODENOSCOPY (EGD);  Surgeon: Milus Banister, MD;  Location: [facility];  Service: Endoscopy;  Laterality: N/A;  . Balloon dilation N/A 09/14/2012    Procedure: BALLOON DILATION;  Surgeon: Milus Banister, MD;  Location: [facility];  Service: Endoscopy;  Laterality: N/A;    Family History  Problem Relation Age of Onset  . Heart attack Mother   . Stroke Mother   . Ulcers Father   . Stroke Father   . Hypertension Brother   . Hypertension Sister   . Heart disease Maternal Grandmother   . Heart disease Maternal Grandfather   . Colon cancer Neg Hx     No Known Allergies  Current Outpatient Prescriptions on File Prior to Visit  Medication Sig Dispense Refill  . amLODipine (NORVASC) 5 MG tablet Take  1 tablet (5 mg total) by mouth daily.  90 tablet  3  . aspirin 81 MG tablet Take 81 mg by mouth daily.      Marland Kitchen atenolol (TENORMIN) 25 MG tablet Take 1 tablet (25 mg total) by mouth every morning.  90 tablet  1  . atorvastatin (LIPITOR) 10 MG tablet Take 1 tablet (10 mg total) by mouth every evening.  90 tablet  1  . calcium carbonate (OS-CAL) 600 MG TABS Take 500 mg by mouth daily.       . Cholecalciferol (VITAMIN D-3) 1000 UNITS CAPS Take 1,000 Units by mouth daily.      . lansoprazole (PREVACID) 30 MG capsule Take 1 capsule (30 mg total) by mouth 2 (two) times daily.  180 capsule  1  . lisinopril (PRINIVIL,ZESTRIL) 10 MG tablet Take 1 tablet (10 mg total) by mouth daily.  90 tablet  1  . oxyCODONE-acetaminophen (PERCOCET) 7.5-325 MG per tablet Take 1 tablet by mouth every 4 (four) hours as needed for pain.  120 tablet  0   No current facility-administered medications on file prior to visit.    BP 136/86   Pulse 68  Temp(Src) 98.2 F (36.8 C) (Oral)  Resp 16  Ht 5' 3.25" (1.607 m)  Wt 140 lb 1.9 oz (63.558 kg)  BMI 24.61 kg/m2  SpO2 96%  LMP 04/05/1988    Objective:   Physical Exam  Constitutional: She is oriented to person, place, and time. She appears well-developed and well-nourished. No distress.  HENT:  Head: Normocephalic and atraumatic.  Cardiovascular: Normal rate and regular rhythm.   No murmur heard. Pulmonary/Chest: Effort normal and breath sounds normal. No respiratory distress. She has no wheezes. She has no rales. She exhibits no tenderness.  Musculoskeletal:       Thoracic back: She exhibits tenderness.  Neurological: She is alert and oriented to person, place, and time.  Psychiatric: She has a normal mood and affect. Her behavior is normal. Judgment and thought content normal.          Assessment & Plan:

## 2013-07-17 ENCOUNTER — Telehealth: Payer: Self-pay | Admitting: Physical Medicine & Rehabilitation

## 2013-07-17 DIAGNOSIS — M549 Dorsalgia, unspecified: Secondary | ICD-10-CM

## 2013-07-17 NOTE — Telephone Encounter (Signed)
Linda Lane with [facility] called about referral that Dr. Letta Pate sent over to their office.  They are unable to accept this referral because patient's primary doctor (Dr. Birdie Riddle)  will have to initiate this referral due to her insurance [facility]).  Any questions please call Linda Lane at [phone] ext. [phone].

## 2013-07-17 NOTE — Telephone Encounter (Signed)
Patient was referred to

## 2013-07-18 NOTE — Telephone Encounter (Signed)
This has been forwarded to Debbrah Alar NP to do the referral

## 2013-07-18 NOTE — Telephone Encounter (Signed)
This came to me by mistake

## 2013-07-18 NOTE — Addendum Note (Signed)
Addended by: Debbrah Alar on: 07/18/2013 02:37 PM   Modules accepted: Orders

## 2013-07-18 NOTE — Telephone Encounter (Signed)
Dr Letta Pate is requesting a referral be made to Dr Lynnda Shields with [facility] (see Dr Letta Pate office note 06/28/13).  We made the referral but because of her insurance, they are telling us that it has to be made by your office.

## 2013-07-23 ENCOUNTER — Telehealth: Payer: Self-pay

## 2013-07-23 MED ORDER — OXYCODONE-ACETAMINOPHEN 7.5-325 MG PO TABS: 1.0000 | ORAL_TABLET | ORAL | Status: DC | PRN

## 2013-07-23 NOTE — Telephone Encounter (Signed)
Okay for 2 week supply of Percocet 7.5 mg 4 times per day #60 Please check with [facility] if there's any other information that they need from [place] for the referral

## 2013-07-23 NOTE — Telephone Encounter (Signed)
Patient called and stated that [facility] is still working on her referral. She will need a refill on her Oxycodone. Last fill date was 3/26. Please advise.

## 2013-07-23 NOTE — Telephone Encounter (Signed)
Printed rx for Kirsteins to sign

## 2013-07-23 NOTE — Telephone Encounter (Signed)
Contacted patient to inform her that her oxycodone RX is ready for pickup.

## 2013-08-07 ENCOUNTER — Telehealth: Payer: Self-pay | Admitting: *Deleted

## 2013-08-07 NOTE — Telephone Encounter (Signed)
I received a note from [facility] [place] [address] [phone]  that she had an appt in May.Please call their office for the appt date and bridge the oxy until the appt.

## 2013-08-07 NOTE — Telephone Encounter (Signed)
Linda Lane has not heard from pain center and she is going to be out of her medication tomorrow.  She needs a refill.  Last refill was a 2 week supply. What to do? Should she make an appt?

## 2013-08-08 ENCOUNTER — Telehealth: Payer: Self-pay | Admitting: Family

## 2013-08-08 MED ORDER — OXYCODONE-ACETAMINOPHEN 7.5-325 MG PO TABS: 1.0000 | ORAL_TABLET | ORAL | Status: DC | PRN

## 2013-08-08 NOTE — Telephone Encounter (Signed)
I called over to the office [facility] ( [phone]) they state Dr. Vira Blanco are still reviewing the notes and there is no appointment set for the patient...Marland KitchenMarland Kitchen

## 2013-08-08 NOTE — Telephone Encounter (Signed)
Rx placed at front desk for pick up and pt notified. 

## 2013-08-08 NOTE — Telephone Encounter (Signed)
OK to refill.   Anderson Malta- could you please check status of pain clinic referral?

## 2013-08-08 NOTE — Telephone Encounter (Signed)
Dena talked to them today and she does not have an appt and they are still reviewing.  Ms Hedgecock called and said she has one pill left and is afraid she will be going through withdrawals.  Can I just print a full rx and have Zella Ball sign?

## 2013-08-08 NOTE — Telephone Encounter (Signed)
I left a message with Inez Catalina @ [facility], awaiting return call

## 2013-08-08 NOTE — Telephone Encounter (Signed)
Patient called to get her oxycodone refilled.

## 2013-08-08 NOTE — Telephone Encounter (Signed)
Requesting refill on pain meds, has yet to be seen by pain management

## 2013-08-09 NOTE — Telephone Encounter (Signed)
May refill oxycodone x 1 mo

## 2013-08-13 NOTE — Telephone Encounter (Signed)
Who is Lemar Livings? PCP?

## 2013-08-13 NOTE — Telephone Encounter (Signed)
Lemar Livings is a Designer, jewellery at [employer] in [place].  There is a refill encounter in the chart.

## 2013-08-13 NOTE — Telephone Encounter (Signed)
Looks like she received a 2 week supply on 08/08/13 from Lemar Livings NP since the prescription was not printed and given to her after you ok'd the refill.

## 2013-08-14 NOTE — Telephone Encounter (Signed)
No more refills if already has one from PCP, may make referrral to [facility] if [facility] not seeing her

## 2013-08-17 ENCOUNTER — Telehealth: Payer: Self-pay | Admitting: *Deleted

## 2013-08-17 MED ORDER — OXYCODONE-ACETAMINOPHEN 7.5-325 MG PO TABS: 1.0000 | ORAL_TABLET | ORAL | Status: DC | PRN

## 2013-08-17 NOTE — Telephone Encounter (Signed)
Received message from pt stating she has appt with pain management on 09/04/13 at 12:30pm. Received #60 oxycodone from [place] on 08/08/13 and is requesting another 2 week supply. States she takes 4-5 of these a day.  Please advise.

## 2013-08-17 NOTE — Telephone Encounter (Signed)
Rx placed at front desk for pick up and pt notified. 

## 2013-08-17 NOTE — Telephone Encounter (Signed)
Rx given. Further refills per pain management.

## 2013-08-29 ENCOUNTER — Telehealth: Payer: Self-pay | Admitting: Physical Medicine & Rehabilitation

## 2013-08-29 NOTE — Telephone Encounter (Signed)
Per Velna Hatchet, Dr. Vira Blanco is not taking [place]... Pt is being contact  To know we will actively find another clinic once the doctor inform us on what to do....   Ad 05.27.2015 at 246 pm

## 2013-08-30 NOTE — Telephone Encounter (Signed)
[facility]

## 2013-08-31 ENCOUNTER — Telehealth: Payer: Self-pay | Admitting: *Deleted

## 2013-08-31 MED ORDER — OXYCODONE-ACETAMINOPHEN 7.5-325 MG PO TABS: 1.0000 | ORAL_TABLET | ORAL | Status: DC | PRN

## 2013-08-31 NOTE — Telephone Encounter (Signed)
I will fill oxycodone temporarily.    Delsa Sale, could you please try to get her in with another pain management group that accepts her insurance?

## 2013-08-31 NOTE — Telephone Encounter (Signed)
Received message from pt that She was contacted by pain management and told they do not accept her insurance and her appt has been cancelled. She states they are trying to set her up with another clinic but is going to need refill of oxycodone on Monday and until she gets in with another clinic.  Please advise.

## 2013-08-31 NOTE — Telephone Encounter (Signed)
Looks like we referred her to Dr Vira Blanco, but he is now not accepting her insurance.   (Per Velna Hatchet, Dr. Vira Blanco is not taking [place]... Pt is being contact  To know we will actively find another clinic once the doctor inform us on what to do....   Ad 05.27.2015 at 246 pm)   Above was in referral notes.

## 2013-08-31 NOTE — Telephone Encounter (Signed)
Discharge letter printed to be signed

## 2013-08-31 NOTE — Telephone Encounter (Signed)
Linda Lane,  Could you please verify this insurance problem with the clinic we tried to set her up with?

## 2013-08-31 NOTE — Telephone Encounter (Signed)
No rx to be given.  We will give her the [place] if she comes to office otherwise mail one to her.

## 2013-08-31 NOTE — Telephone Encounter (Signed)
Looks like [facility] has authorized 144 tablets, 2 week supply for this patient

## 2013-08-31 NOTE — Telephone Encounter (Signed)
[place].  She is not familiar with it so I will give her a pamphlet.  She is going to be out of her medication on Monday.  Dr Letta Pate has agreed to write her one final rx as a wean down dose of her pain medication.  She will pick this up Monday and the [place] information as well.

## 2013-09-03 NOTE — Telephone Encounter (Signed)
Noted  

## 2013-09-03 NOTE — Telephone Encounter (Signed)
Notified pt. She has been given info from Dr Jodene Nam office to contact [facility].

## 2013-09-19 ENCOUNTER — Telehealth: Payer: Self-pay | Admitting: Family

## 2013-09-19 NOTE — Telephone Encounter (Signed)
Referral to pain management is in system.  Can we get patient in with [facility].  Let me know if I need to put in new referral that specifies practice.

## 2013-09-19 NOTE — Telephone Encounter (Signed)
Patient states that we had referred [facility] to [facility] but they do not accept her insurance. She would like to be referred to [facility].

## 2013-09-24 ENCOUNTER — Ambulatory Visit (INDEPENDENT_AMBULATORY_CARE_PROVIDER_SITE_OTHER): Payer: Commercial Managed Care - HMO | Admitting: *Deleted

## 2013-09-24 DIAGNOSIS — I498 Other specified cardiac arrhythmias: Secondary | ICD-10-CM

## 2013-09-24 DIAGNOSIS — R001 Bradycardia, unspecified: Secondary | ICD-10-CM

## 2013-09-24 LAB — MDC_IDC_ENUM_SESS_TYPE_REMOTE
Battery Impedance: 1998 Ohm
Battery Remaining Longevity: 30 mo
Brady Statistic AP VP Percent: 24 %
Brady Statistic AS VP Percent: 7 %
Lead Channel Impedance Value: 546 Ohm
Lead Channel Impedance Value: 652 Ohm
Lead Channel Pacing Threshold Amplitude: 0.875 V
Lead Channel Sensing Intrinsic Amplitude: 2 mV
Lead Channel Sensing Intrinsic Amplitude: 8 mV
Lead Channel Setting Pacing Pulse Width: 0.4 ms
Lead Channel Setting Sensing Sensitivity: 2 mV
MDC IDC MSMT BATTERY VOLTAGE: 2.76 V
MDC IDC MSMT LEADCHNL RA PACING THRESHOLD PULSEWIDTH: 0.4 ms
MDC IDC MSMT LEADCHNL RV PACING THRESHOLD AMPLITUDE: 1 V
MDC IDC MSMT LEADCHNL RV PACING THRESHOLD PULSEWIDTH: 0.4 ms
MDC IDC SESS DTM: 20150622131612
MDC IDC SET LEADCHNL RA PACING AMPLITUDE: 2 V
MDC IDC SET LEADCHNL RV PACING AMPLITUDE: 2.5 V
MDC IDC STAT BRADY AP VS PERCENT: 55 %
MDC IDC STAT BRADY AS VS PERCENT: 15 %

## 2013-09-24 NOTE — Progress Notes (Signed)
Remote pacemaker transmission.   

## 2013-09-25 ENCOUNTER — Encounter: Payer: Self-pay | Admitting: Family

## 2013-09-25 ENCOUNTER — Ambulatory Visit (INDEPENDENT_AMBULATORY_CARE_PROVIDER_SITE_OTHER): Payer: Commercial Managed Care - HMO | Admitting: Family

## 2013-09-25 VITALS — BP 140/86 | HR 81 | Temp 97.9°F | Ht 63.25 in | Wt 138.0 lb

## 2013-09-25 DIAGNOSIS — M549 Dorsalgia, unspecified: Secondary | ICD-10-CM

## 2013-09-25 DIAGNOSIS — M546 Pain in thoracic spine: Secondary | ICD-10-CM

## 2013-09-25 DIAGNOSIS — G8929 Other chronic pain: Secondary | ICD-10-CM

## 2013-09-25 MED ORDER — LANSOPRAZOLE 30 MG PO CPDR: 30.0000 mg | DELAYED_RELEASE_CAPSULE | Freq: Two times a day (BID) | ORAL | Status: DC

## 2013-09-25 MED ORDER — OXYCODONE-ACETAMINOPHEN 7.5-325 MG PO TABS: 1.0000 | ORAL_TABLET | ORAL | Status: DC | PRN

## 2013-09-25 MED ORDER — LISINOPRIL 10 MG PO TABS: 10.0000 mg | ORAL_TABLET | Freq: Every day | ORAL | Status: DC

## 2013-09-25 MED ORDER — ATENOLOL 25 MG PO TABS: 25.0000 mg | ORAL_TABLET | Freq: Every morning | ORAL | Status: DC

## 2013-09-25 MED ORDER — ATORVASTATIN CALCIUM 10 MG PO TABS: 10.0000 mg | ORAL_TABLET | Freq: Every evening | ORAL | Status: DC

## 2013-09-25 NOTE — Patient Instructions (Signed)
Please follow up in 3 months. You will be contacted about your referral to pain management.  Please let us know if you have not heard back within 1 week about your referral.

## 2013-09-25 NOTE — Assessment & Plan Note (Signed)
Controlled on pain medication. Will refer to [facility] as requested.

## 2013-09-25 NOTE — Progress Notes (Signed)
Pre visit review using our clinic review tool, if applicable. No additional management support is needed unless otherwise documented below in the visit note. 

## 2013-09-25 NOTE — Progress Notes (Signed)
Subjective:    Patient ID: Linda Lane, female    DOB: 12-14-1940, 73 y.o.   MRN: [id number]  HPI  Linda Lane is a 73 yr old female who presents today for follow up.   Chronic pain-  Pt has hx of compression fracture and chronic low back pain. She would like referral to [facility] pain management. She has had issues with her insurance not being accepted at another group. She reports that if she misses a dose of oxycodone that her back pain returns.  Has trouble sleeping without oxycodone due to pain. "I don't think I can come off of the pain medication.    Review of Systems    see HPI  Past Medical History  Diagnosis Date  . HTN (hypertension)   . 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 History of gastroesophageal reflux (GERD)   . Peptic ulcer disease   . Hyperlipidemia   . History of colon polyps   . CAD (coronary artery disease)   . SOB (shortness of breath)   . Cancer 2005    history of melanoma face  . Complication of anesthesia 2003 and 2004    woke up during colonscopy  . Ankle edema for last 1 week    both ankles  . Osteoporosis   . Osteoporosis, unspecified 09/19/2012    History   Social History  . Marital Status: Divorced    Spouse Name: N/A    Number of Children: 2  . Years of Education: N/A   Occupational History  . Not on file.   Social History Main Topics  . Smoking status: Current Every Day Smoker -- 0.50 packs/day    Types: Cigarettes  . Smokeless tobacco: Never Used  . Alcohol Use: No     Comment: no alcohol since jan 2014  . Drug Use: No  . Sexual Activity: Not on file   Other Topics Concern  . Not on file   Social History Narrative  . No narrative on file    Past Surgical History  Procedure Laterality Date  . Middle ear surgery Bilateral   . Abdominal hysterectomy    . Cholecystectomy    . Appendectomy    . Bilateral breast implants    . Laparoscopic gastric banding    . Gastric bypass    . Tonsillectomy   1962  . Pacemaker placement  1999, 2007, 2010  . Melanoma excision  2005    beneath chin  . Esophagogastroduodenoscopy N/A 09/14/2012    Procedure: ESOPHAGOGASTRODUODENOSCOPY (EGD);  Surgeon: Milus Banister, MD;  Location: [facility];  Service: Endoscopy;  Laterality: N/A;  . Balloon dilation N/A 09/14/2012    Procedure: BALLOON DILATION;  Surgeon: Milus Banister, MD;  Location: [facility];  Service: Endoscopy;  Laterality: N/A;    Family History  Problem Relation Age of Onset  . Heart attack Mother   . Stroke Mother   . Ulcers Father   . Stroke Father   . Hypertension Brother   . Hypertension Sister   . Heart disease Maternal Grandmother   . Heart disease Maternal Grandfather   . Colon cancer Neg Hx     No Known Allergies  Current Outpatient Prescriptions on File Prior to Visit  Medication Sig Dispense Refill  . amLODipine (NORVASC) 5 MG tablet Take 1 tablet (5 mg total) by mouth daily.  90 tablet  3  . aspirin 81 MG tablet Take 81 mg by mouth daily.      Marland Kitchen  calcium carbonate (OS-CAL) 600 MG TABS Take 500 mg by mouth daily.       . Cholecalciferol (VITAMIN D-3) 1000 UNITS CAPS Take 1,000 Units by mouth daily.       No current facility-administered medications on file prior to visit.    BP 140/86  Pulse 81  Temp(Src) 97.9 F (36.6 C) (Oral)  Ht 5' 3.25" (1.607 m)  Wt 138 lb (62.596 kg)  BMI 24.24 kg/m2  SpO2 97%  LMP 04/05/1988    Objective:   Physical Exam  Constitutional: She is oriented to person, place, and time. She appears well-developed and well-nourished. No distress.  HENT:  Head: Normocephalic and atraumatic.  Cardiovascular: Normal rate and regular rhythm.   No murmur heard. Pulmonary/Chest: Effort normal and breath sounds normal. No respiratory distress. She has no wheezes. She has no rales. She exhibits no tenderness.  Neurological: She is alert and oriented to person, place, and time.  Psychiatric: She has a normal mood and affect. Her behavior  is normal. Judgment and thought content normal.          Assessment & Plan:

## 2013-09-26 ENCOUNTER — Telehealth: Payer: Self-pay | Admitting: Family

## 2013-09-26 NOTE — Telephone Encounter (Signed)
Spoke to patient, who stated that she will call her insurance company and find out which specialist they approve of. Patient will contact office when she has an answer.

## 2013-09-26 NOTE — Telephone Encounter (Signed)
Please contact pt and let her know that [facility] will not accept her insurance.  I would like her to contact her insurance please and request from them a pain management specialist in the area which is covered by her plan and we will make referral.

## 2013-09-26 NOTE — Telephone Encounter (Signed)
Patient returned phone call. °

## 2013-09-28 NOTE — Telephone Encounter (Signed)
Anderson Malta- would you please send request to Dr. Suella Broad?

## 2013-09-28 NOTE — Telephone Encounter (Signed)
Patient called back stating that Dr. Delfino Lovett Ramos(P: [phone]) accepts her insurance.

## 2013-10-09 ENCOUNTER — Telehealth: Payer: Self-pay | Admitting: *Deleted

## 2013-10-09 NOTE — Telephone Encounter (Signed)
Pt left message that Dr Nelva Bush' office told her they do not take pain management patients and she wants to know what she should do now?

## 2013-10-09 NOTE — Telephone Encounter (Signed)
I would ask her to contact her insurance again and see if they have any other providers for pain management in [place], [place] or [place] and let me know.

## 2013-10-10 NOTE — Telephone Encounter (Signed)
Spoke with pt. She contacted insurance and was given [facility] in [place], ph) [phone].

## 2013-10-10 NOTE — Telephone Encounter (Signed)
Anderson Malta, could you please fax referral request to [facility]?

## 2013-10-12 NOTE — Telephone Encounter (Signed)
done

## 2013-10-15 ENCOUNTER — Encounter: Payer: Self-pay | Admitting: Internal Medicine

## 2013-10-22 ENCOUNTER — Telehealth: Payer: Self-pay | Admitting: Family

## 2013-10-22 NOTE — Telephone Encounter (Signed)
Patient called back stating that she would like to be referred to [facility] neuroscience in [address]

## 2013-10-22 NOTE — Telephone Encounter (Signed)
Dr Mina Marble does not do pain management, is their someone else that you can recommend? Pt is aware.

## 2013-10-22 NOTE — Telephone Encounter (Signed)
Patient left a message stating that the pain management doctor we sent her to does injections for pain and can not manage her pain, she contacted her insurance company and they suggested two pain management doctors in [place],  Dr. Arlice Colt [facility] @ [phone]

## 2013-10-22 NOTE — Telephone Encounter (Signed)
Please forward referral to Dr. Tamsen Snider

## 2013-10-24 NOTE — Telephone Encounter (Signed)
Contacted Dr Garth Bigness office, he does not do pain management. Would you like me to try [facility]?

## 2013-10-24 NOTE — Telephone Encounter (Signed)
Referral faxed to [facility], awaiting appt

## 2013-10-24 NOTE — Telephone Encounter (Signed)
Anderson Malta, please fax to Arlice Colt MD. Thanks!

## 2013-10-24 NOTE — Telephone Encounter (Signed)
Yes please

## 2013-10-26 ENCOUNTER — Other Ambulatory Visit: Payer: Self-pay | Admitting: *Deleted

## 2013-10-26 MED ORDER — OXYCODONE-ACETAMINOPHEN 7.5-325 MG PO TABS: 1.0000 | ORAL_TABLET | ORAL | Status: DC | PRN

## 2013-10-26 NOTE — Telephone Encounter (Signed)
See rx. 

## 2013-10-26 NOTE — Telephone Encounter (Signed)
Melissa signed rx and is at the front desk. Patient informed.

## 2013-10-26 NOTE — Telephone Encounter (Signed)
We are still trying to get pt in with participating pain management Provider. Pt will be due for refill of oxycodone on 10/28/13 and is requesting Rx.  Rx pended and forwarded to Provider for approval. Pt would like to pick up Rx today.

## 2013-11-20 ENCOUNTER — Other Ambulatory Visit: Payer: Self-pay

## 2013-11-20 NOTE — Telephone Encounter (Signed)
Pt stated that " she's still waiting on referral from Dr. Ander Slade office before she can get an appt. In the meantime she's going to be out of her meds by Monday. She stated the needs #60 5 mg and the other one 7.5 because she wants to cut down on pain meds." LDM

## 2013-11-21 MED ORDER — OXYCODONE-ACETAMINOPHEN 7.5-325 MG PO TABS: ORAL_TABLET | ORAL | Status: DC

## 2013-11-21 NOTE — Telephone Encounter (Signed)
Rx signed, printed and at front desk for pickup.  As Linda Lane directed, she is to try taking a half a tablet as needed for pain relief.  Can use whole tablet if pain severe.

## 2013-11-21 NOTE — Telephone Encounter (Signed)
I would recommend that she fill the 7.5's and try cutting in half as tolerated.  Please see if Einar Pheasant or Dr. Charlett Blake are will to sign while I am on vacation.    Also, we tried to get [facility] in with [facility]. Has she heard back from them?  Where is Dr. Elijah Birk located and what is his/her number?

## 2013-11-21 NOTE — Telephone Encounter (Signed)
Lov 09/25/13 Lrd 10/26/13  Please advise. LDM

## 2013-11-27 ENCOUNTER — Ambulatory Visit: Payer: Medicare HMO | Admitting: Family

## 2013-12-07 ENCOUNTER — Ambulatory Visit (INDEPENDENT_AMBULATORY_CARE_PROVIDER_SITE_OTHER): Payer: Commercial Managed Care - HMO | Admitting: Family

## 2013-12-07 ENCOUNTER — Encounter: Payer: Self-pay | Admitting: Family

## 2013-12-07 VITALS — BP 120/70 | HR 93 | Temp 98.1°F | Resp 18 | Ht 63.25 in | Wt 138.2 lb

## 2013-12-07 DIAGNOSIS — M810 Age-related osteoporosis without current pathological fracture: Secondary | ICD-10-CM

## 2013-12-07 DIAGNOSIS — M546 Pain in thoracic spine: Secondary | ICD-10-CM

## 2013-12-07 DIAGNOSIS — M549 Dorsalgia, unspecified: Secondary | ICD-10-CM

## 2013-12-07 DIAGNOSIS — G8929 Other chronic pain: Secondary | ICD-10-CM

## 2013-12-07 DIAGNOSIS — Z23 Encounter for immunization: Secondary | ICD-10-CM

## 2013-12-07 DIAGNOSIS — I10 Essential (primary) hypertension: Secondary | ICD-10-CM

## 2013-12-07 LAB — BASIC METABOLIC PANEL
BUN: 23 mg/dL (ref 6–23)
CO2: 29 mEq/L (ref 19–32)
Calcium: 8.9 mg/dL (ref 8.4–10.5)
Chloride: 103 mEq/L (ref 96–112)
Creatinine, Ser: 0.8 mg/dL (ref 0.4–1.2)
GFR: 73.93 mL/min (ref >60.00)
GLUCOSE: 87 mg/dL (ref 70–99)
POTASSIUM: 4.5 meq/L (ref 3.5–5.1)
Sodium: 138 mEq/L (ref 135–145)

## 2013-12-07 MED ORDER — DENOSUMAB 60 MG/ML ~~LOC~~ SOLN
60.0000 mg | Freq: Once | SUBCUTANEOUS | Status: AC
  Administered 2013-12-07: 60 mg via SUBCUTANEOUS

## 2013-12-07 MED ORDER — ROPINIROLE HCL 1 MG PO TABS: 1.0000 mg | ORAL_TABLET | Freq: Every day | ORAL | Status: DC

## 2013-12-07 NOTE — Progress Notes (Signed)
Subjective:    Patient ID: Linda Lane, female    DOB: 1940-09-26, 73 y.o.   MRN: [id number]  HPI  Linda Lane is a 73 yr old female who presents today for follow up of multiple medical problems:  1) HTN- current meds include amlodipine, atenolol, lisinopril. BP Readings from Last 3 Encounters:  12/07/13 120/70  09/25/13 140/86  07/13/13 136/86   2) Osteoporosis-  Due for Prolia injection.  Her last bone density was in March 2014.    3) Chronic pain-  She is maintained on percocet and multiple attempts to refer to pain management have been made. She reports that she is waiting for call back from pain management. She is now taking 1/2 tab percocet in AM.  No pain at present. Pain is located in the mid/lower back.  Reports that she has RLS symptoms.  Using 4-5 tabs of percocet/day.    Review of Systems See HPI  Past Medical History  Diagnosis Date  . HTN (hypertension)   . 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 History of gastroesophageal reflux (GERD)   . Peptic ulcer disease   . Hyperlipidemia   . History of colon polyps   . CAD (coronary artery disease)   . SOB (shortness of breath)   . Cancer 2005    history of melanoma face  . Complication of anesthesia 2003 and 2004    woke up during colonscopy  . Ankle edema for last 1 week    both ankles  . Osteoporosis   . Osteoporosis, unspecified 09/19/2012    History   Social History  . Marital Status: Divorced    Spouse Name: N/A    Number of Children: 2  . Years of Education: N/A   Occupational History  . Not on file.   Social History Main Topics  . Smoking status: Current Every Day Smoker -- 0.50 packs/day    Types: Cigarettes  . Smokeless tobacco: Never Used     Comment: 8-10 cigarettes a day  . Alcohol Use: No     Comment: no alcohol since jan 2014  . Drug Use: No  . Sexual Activity: Not on file   Other Topics Concern  . Not on file   Social History Narrative  . No narrative on  file    Past Surgical History  Procedure Laterality Date  . Middle ear surgery Bilateral   . Abdominal hysterectomy    . Cholecystectomy    . Appendectomy    . Bilateral breast implants    . Laparoscopic gastric banding    . Gastric bypass    . Tonsillectomy  1962  . Pacemaker placement  1999, 2007, 2010  . Melanoma excision  2005    beneath chin  . Esophagogastroduodenoscopy N/A 09/14/2012    Procedure: ESOPHAGOGASTRODUODENOSCOPY (EGD);  Surgeon: Milus Banister, MD;  Location: [facility];  Service: Endoscopy;  Laterality: N/A;  . Balloon dilation N/A 09/14/2012    Procedure: BALLOON DILATION;  Surgeon: Milus Banister, MD;  Location: [facility];  Service: Endoscopy;  Laterality: N/A;    Family History  Problem Relation Age of Onset  . Heart attack Mother   . Stroke Mother   . Ulcers Father   . Stroke Father   . Hypertension Brother   . Hypertension Sister   . Heart disease Maternal Grandmother   . Heart disease Maternal Grandfather   . Colon cancer Neg Hx     No Known Allergies  Current Outpatient Prescriptions on File Prior to Visit  Medication Sig Dispense Refill  . amLODipine (NORVASC) 5 MG tablet Take 1 tablet (5 mg total) by mouth daily.  90 tablet  3  . aspirin 81 MG tablet Take 81 mg by mouth daily.      Marland Kitchen atenolol (TENORMIN) 25 MG tablet Take 1 tablet (25 mg total) by mouth every morning.  90 tablet  1  . atorvastatin (LIPITOR) 10 MG tablet Take 1 tablet (10 mg total) by mouth every evening.  90 tablet  1  . calcium carbonate (OS-CAL) 600 MG TABS Take 500 mg by mouth daily.       . Cholecalciferol (VITAMIN D-3) 1000 UNITS CAPS Take 1,000 Units by mouth daily.      . lansoprazole (PREVACID) 30 MG capsule Take 1 capsule (30 mg total) by mouth 2 (two) times daily.  180 capsule  1  . lisinopril (PRINIVIL,ZESTRIL) 10 MG tablet Take 1 tablet (10 mg total) by mouth daily.  90 tablet  1  . oxyCODONE-acetaminophen (PERCOCET) 7.5-325 MG per tablet 1/2-1 tab every 4  hours as needed for pain.  150 tablet  0   No current facility-administered medications on file prior to visit.    BP 120/70  Pulse 93  Temp(Src) 98.1 F (36.7 C) (Oral)  Resp 18  Ht 5' 3.25" (1.607 m)  Wt 138 lb 3.2 oz (62.687 kg)  BMI 24.27 kg/m2  SpO2 98%  LMP 04/05/1988       Objective:   Physical Exam  Constitutional: She is oriented to person, place, and time. She appears well-developed and well-nourished. No distress.  HENT:  Head: Normocephalic and atraumatic.  Cardiovascular: Normal rate and regular rhythm.   No murmur heard. Pulmonary/Chest: Effort normal and breath sounds normal. No respiratory distress. She has no wheezes. She has no rales. She exhibits no tenderness.  Neurological: She is alert and oriented to person, place, and time.  Psychiatric: She has a normal mood and affect. Her behavior is normal. Judgment and thought content normal.          Assessment & Plan:

## 2013-12-07 NOTE — Assessment & Plan Note (Addendum)
Prolia injection today

## 2013-12-07 NOTE — Assessment & Plan Note (Addendum)
Await pain management referral. Obtain Urine drug screen due to high risk medication.

## 2013-12-07 NOTE — Patient Instructions (Signed)
Please continue current medications. Go to lab for blood draw and Urine Drug Screen. Follow up in 3 months.

## 2013-12-07 NOTE — Progress Notes (Signed)
Pre visit review using our clinic review tool, if applicable. No additional management support is needed unless otherwise documented below in the visit note. 

## 2013-12-07 NOTE — Assessment & Plan Note (Signed)
BP stable on current meds. Continue same.  Obtain bmet.  Flu shot today

## 2013-12-10 ENCOUNTER — Encounter: Payer: Self-pay | Admitting: Family

## 2013-12-12 ENCOUNTER — Telehealth: Payer: Self-pay | Admitting: Family

## 2013-12-12 NOTE — Telephone Encounter (Signed)
I sent pt's info for insurance verification electronically yesterday and have already rec'd the info back.  Ms. Morrison Masser requires a prior authorization, which we do have on file and valid from 05/03/2013 through 05/03/2014, authorization #[id number]. If there is an OV, pt will have a $45 co-pay PLUS 20% of the admin and Prolia (approx. $180), which means w/an OV pt's estimated responsibility will be $225; Without an OV pt will be responsible for 20%, which is approximately $180. Please make pt aware this is only an estimate and we won't know an exact amt until [facility] pays. I have faxed a copy of the summary of benefits to your attn and have also sent a copy to be scanned into pt's chart. If pt cannot afford the estimated amt which is her responsibility, I do have a # she can call to see if she is eligible for financial assistance from [place].  Please let me know if she needs the # and I will get it to you. If you have any further questions, please let me know.

## 2013-12-13 NOTE — Telephone Encounter (Signed)
Pt aware from previous injection. Benefit has not changed.

## 2013-12-25 ENCOUNTER — Telehealth: Payer: Self-pay | Admitting: Family

## 2013-12-25 MED ORDER — OXYCODONE-ACETAMINOPHEN 7.5-325 MG PO TABS: ORAL_TABLET | ORAL | Status: DC

## 2013-12-25 NOTE — Telephone Encounter (Signed)
Patient informed Rx ready for p/u during regular business hours; understood & agreed/SLS

## 2013-12-25 NOTE — Telephone Encounter (Signed)
Refill request for Oxycodone 7.5-325 mg Last filled by MD on - 08.19.15, #[id number] [FILL ON OR AFTER 08.25.15] Last AEX - 09.04.15 Next AEX - 3 Mths. Please Advise on refills/SLS

## 2013-12-25 NOTE — Telephone Encounter (Signed)
See rx. 

## 2013-12-25 NOTE — Telephone Encounter (Signed)
Caller name: Ravneet Relation to pt: self Call back number: [phone] Pharmacy:  Reason for call:   Patient is requesting a new oxy rx

## 2013-12-26 ENCOUNTER — Ambulatory Visit (INDEPENDENT_AMBULATORY_CARE_PROVIDER_SITE_OTHER): Payer: Commercial Managed Care - HMO | Admitting: *Deleted

## 2013-12-26 ENCOUNTER — Encounter: Payer: Self-pay | Admitting: Internal Medicine

## 2013-12-26 ENCOUNTER — Telehealth: Payer: Self-pay | Admitting: Family

## 2013-12-26 DIAGNOSIS — R001 Bradycardia, unspecified: Secondary | ICD-10-CM

## 2013-12-26 DIAGNOSIS — I498 Other specified cardiac arrhythmias: Secondary | ICD-10-CM

## 2013-12-26 NOTE — Progress Notes (Signed)
Remote pacemaker transmission.   

## 2013-12-26 NOTE — Telephone Encounter (Signed)
Patient wanted you to know the rx for jumpy legs works well for her.  She can sleep 4 hours straight now

## 2013-12-27 LAB — MDC_IDC_ENUM_SESS_TYPE_REMOTE
Battery Impedance: 1924 Ohm
Battery Remaining Longevity: 31 mo
Battery Voltage: 2.75 V
Brady Statistic AP VP Percent: 24 %
Brady Statistic AS VS Percent: 13 %
Date Time Interrogation Session: 20150923132740
Lead Channel Impedance Value: 610 Ohm
Lead Channel Impedance Value: 678 Ohm
Lead Channel Pacing Threshold Amplitude: 0.625 V
Lead Channel Pacing Threshold Pulse Width: 0.4 ms
Lead Channel Setting Pacing Amplitude: 2 V
Lead Channel Setting Pacing Amplitude: 2.5 V
Lead Channel Setting Pacing Pulse Width: 0.4 ms
Lead Channel Setting Sensing Sensitivity: 2 mV
MDC IDC MSMT LEADCHNL RA PACING THRESHOLD AMPLITUDE: 0.875 V
MDC IDC MSMT LEADCHNL RA PACING THRESHOLD PULSEWIDTH: 0.4 ms
MDC IDC MSMT LEADCHNL RV SENSING INTR AMPL: 5.6 mV
MDC IDC STAT BRADY AP VS PERCENT: 56 %
MDC IDC STAT BRADY AS VP PERCENT: 7 %

## 2014-01-07 ENCOUNTER — Encounter: Payer: Self-pay | Admitting: Cardiology

## 2014-01-25 ENCOUNTER — Telehealth: Payer: Self-pay | Admitting: Family Medicine

## 2014-01-25 MED ORDER — OXYCODONE-ACETAMINOPHEN 7.5-325 MG PO TABS: ORAL_TABLET | ORAL | Status: DC

## 2014-01-25 NOTE — Telephone Encounter (Signed)
Rx placed at front desk and pt notified she can pick it up on Monday.

## 2014-01-25 NOTE — Telephone Encounter (Signed)
Caller name: Jayci, Ellefson Relation to pt: self  Call back number: [phone]   Reason for call:  Requesting a refill oxyCODONE-acetaminophen (PERCOCET) 7.5-325 MG per tablet

## 2014-01-25 NOTE — Telephone Encounter (Signed)
Yes. Rx printed signed and placed at your desk to call patient.

## 2014-01-25 NOTE — Telephone Encounter (Signed)
Einar Pheasant-- Lenna Sciara is out of the office until Tuesday.  Would you be able to sign rx for pt in her absence?

## 2014-02-22 ENCOUNTER — Ambulatory Visit (INDEPENDENT_AMBULATORY_CARE_PROVIDER_SITE_OTHER): Payer: Commercial Managed Care - HMO | Admitting: Physician Assistant

## 2014-02-22 ENCOUNTER — Encounter: Payer: Self-pay | Admitting: Physician Assistant

## 2014-02-22 ENCOUNTER — Ambulatory Visit (HOSPITAL_BASED_OUTPATIENT_CLINIC_OR_DEPARTMENT_OTHER)
Admission: RE | Admit: 2014-02-22 | Discharge: 2014-02-22 | Disposition: A | Discharge status: Home / Self Care | Payer: Medicare HMO | Source: Ambulatory Visit | Attending: Physician Assistant | Admitting: Physician Assistant

## 2014-02-22 VITALS — BP 120/80 | HR 87 | Temp 98.1°F | Resp 16 | Wt 147.0 lb

## 2014-02-22 DIAGNOSIS — M8938 Hypertrophy of bone, other site: Secondary | ICD-10-CM | POA: Insufficient documentation

## 2014-02-22 DIAGNOSIS — M545 Low back pain: Secondary | ICD-10-CM | POA: Diagnosis present

## 2014-02-22 DIAGNOSIS — W19XXXA Unspecified fall, initial encounter: Secondary | ICD-10-CM | POA: Insufficient documentation

## 2014-02-22 DIAGNOSIS — Y92009 Unspecified place in unspecified non-institutional (private) residence as the place of occurrence of the external cause: Principal | ICD-10-CM

## 2014-02-22 DIAGNOSIS — M8580 Other specified disorders of bone density and structure, unspecified site: Secondary | ICD-10-CM | POA: Insufficient documentation

## 2014-02-22 DIAGNOSIS — W010XXA Fall on same level from slipping, tripping and stumbling without subsequent striking against object, initial encounter: Secondary | ICD-10-CM

## 2014-02-22 MED ORDER — ROPINIROLE HCL 1 MG PO TABS: 1.0000 mg | ORAL_TABLET | Freq: Every day | ORAL | Status: DC

## 2014-02-22 NOTE — Progress Notes (Signed)
Patient presents to clinic today c/o pain in lower back after sustaining a fall at home 2 days ago.  Patient endorses fall onto buttocks and lower back after slipping in a puddle in her kitchen.  Denies head trauma, LOC or amnesia.  Denies lightheadedness, dizziness or SOB prior to fall.  Has history of osteoporosis and a vertebral fracture a few years prior.  Is currently on Percocet TID-QID for compression fracture.   Past Medical History  Diagnosis Date  . HTN (hypertension)   . 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 History of gastroesophageal reflux (GERD)   . Peptic ulcer disease   . Hyperlipidemia   . History of colon polyps   . CAD (coronary artery disease)   . SOB (shortness of breath)   . Cancer 2005    history of melanoma face  . Complication of anesthesia 2003 and 2004    woke up during colonscopy  . Ankle edema for last 1 week    both ankles  . Osteoporosis   . Osteoporosis, unspecified 09/19/2012    Current Outpatient Prescriptions on File Prior to Visit  Medication Sig Dispense Refill  . amLODipine (NORVASC) 5 MG tablet Take 1 tablet (5 mg total) by mouth daily. 90 tablet 3  . aspirin 81 MG tablet Take 81 mg by mouth daily.    Marland Kitchen atenolol (TENORMIN) 25 MG tablet Take 1 tablet (25 mg total) by mouth every morning. 90 tablet 1  . atorvastatin (LIPITOR) 10 MG tablet Take 1 tablet (10 mg total) by mouth every evening. 90 tablet 1  . calcium carbonate (OS-CAL) 600 MG TABS Take 500 mg by mouth daily.     . Cholecalciferol (VITAMIN D-3) 1000 UNITS CAPS Take 1,000 Units by mouth daily.    . lansoprazole (PREVACID) 30 MG capsule Take 1 capsule (30 mg total) by mouth 2 (two) times daily. 180 capsule 1  . lisinopril (PRINIVIL,ZESTRIL) 10 MG tablet Take 1 tablet (10 mg total) by mouth daily. 90 tablet 1  . oxyCODONE-acetaminophen (PERCOCET) 7.5-325 MG per tablet 1/2-1 tab every 4 hours as needed for pain. 150 tablet 0   No current facility-administered  medications on file prior to visit.    No Known Allergies  Family History  Problem Relation Age of Onset  . Heart attack Mother   . Stroke Mother   . Ulcers Father   . Stroke Father   . Hypertension Brother   . Hypertension Sister   . Heart disease Maternal Grandmother   . Heart disease Maternal Grandfather   . Colon cancer Neg Hx     History   Social History  . Marital Status: Divorced    Spouse Name: N/A    Number of Children: 2  . Years of Education: N/A   Social History Main Topics  . Smoking status: Current Every Day Smoker -- 0.50 packs/day    Types: Cigarettes  . Smokeless tobacco: Never Used     Comment: 8-10 cigarettes a day  . Alcohol Use: No     Comment: no alcohol since jan 2014  . Drug Use: No  . Sexual Activity: None   Other Topics Concern  . None   Social History Narrative   Review of Systems - See HPI.  All other ROS are negative.  BP 120/80 mmHg  Pulse 87  Temp(Src) 98.1 F (36.7 C) (Oral)  Resp 16  Wt 147 lb (66.679 kg)  SpO2 98%  LMP 04/05/1988  Physical Exam  Constitutional: She is oriented to person, place, and time and well-developed, well-nourished, and in no distress.  HENT:  Head: Normocephalic and atraumatic.  Eyes: Conjunctivae are normal.  Cardiovascular: Normal rate, regular rhythm, normal heart sounds and intact distal pulses.   Pulmonary/Chest: Effort normal and breath sounds normal. No respiratory distress. She has no wheezes. She has no rales. She exhibits no tenderness.  Musculoskeletal: Normal range of motion.       Lumbar back: She exhibits tenderness and pain. She exhibits normal range of motion, no bony tenderness and no spasm.  Neurological: She is alert and oriented to person, place, and time.  Skin: Skin is warm and dry. No rash noted.  Psychiatric: Affect normal.  Vitals reviewed.  Recent Results (from the past 2160 hour(s))  Basic Metabolic Panel (BMET)     Status: None   Collection Time: 12/07/13  1:59 PM    Result Value Ref Range   Sodium 138 135 - 145 mEq/L   Potassium 4.5 3.5 - 5.1 mEq/L   Chloride 103 96 - 112 mEq/L   CO2 29 19 - 32 mEq/L   Glucose, Bld 87 70 - 99 mg/dL   BUN 23 6 - 23 mg/dL   Creatinine, Ser 0.8 0.4 - 1.2 mg/dL   Calcium 8.9 8.4 - 10.5 mg/dL   GFR 73.93 >60.00 mL/min  Implantable device - remote     Status: None   Collection Time: 12/27/13  4:43 PM  Result Value Ref Range   Date Time Interrogation Session [id number]    Pulse Generator Manufacturer Medtronic    Pulse Gen Model [id number] Adapta    Pulse Gen Serial Number [id number]    RV Sense Sensitivity 2 mV   RA Pace Amplitude 2 V   RV Pace PulseWidth 0.4 ms   RV Pace Amplitude 2.5 V   RA Impedance 678 ohm   RA Pacing Amplitude 0.875 V   RA Pacing PulseWidth 0.4 ms   RV IMPEDANCE 610 ohm   RV Amplitude 5.6 mV   RV Pacing Amplitude 0.625 V   RV Pacing PulseWidth 0.4 ms   Battery Status Unknown    Battery Longevity 31 mo   Battery Voltage 2.75 V   Battery Impedance 1924 ohm   Brady AP VP Percent 24 %   Brady AS VP Percent 7 %   Brady AP VS Percent 56 %   Brady AS VS Percent 13 %   Eval Rhythm Ap/Vp    Miscellaneous Comment      Pacemaker remote check. Device function reviewed. Impedance, sensing, auto capture thresholds consistent with previous measurements. Histograms appropriate for patient and level of activity. All other diagnostic data reviewed and is appropriate and  stable for patient. Real time/magnet EGM shows appropriate sensing and capture. 448 mode switches--- <0.1%, 1 EGM shows FFRW. 2 ventricular high rate episodes---both SVT. Estimated longevity 2.42yrs. [facility] 04/01/14 & ROV w/ GT in 58mo.    Assessment/Plan: Fall at home Slipped on wet area.  No head trauma or LOC. Exam good overall.  Some moderate lower back pain.  Giving history, will obtain x-ray of lumbar spine.  Continue Percocet as directed.  Apply topical Aspercreme to lower back.  Heating pad in 10-minute intervals.  Return  precautions discussed with patient.

## 2014-02-22 NOTE — Progress Notes (Signed)
Pre visit review using our clinic review tool, if applicable. No additional management support is needed unless otherwise documented below in the visit note. 

## 2014-02-22 NOTE — Patient Instructions (Signed)
Please continue Percocet as directed.  Apply Aspercreme to lower back.  Use heating pad a couple of times per day, for no more than 10 minutes at a time.  Please go downstairs for x-ray.  I will call you with your results.  Follow-up if symptoms are not resolving over the next week.

## 2014-02-22 NOTE — Assessment & Plan Note (Signed)
Slipped on wet area.  No head trauma or LOC. Exam good overall.  Some moderate lower back pain.  Giving history, will obtain x-ray of lumbar spine.  Continue Percocet as directed.  Apply topical Aspercreme to lower back.  Heating pad in 10-minute intervals.  Return precautions discussed with patient.

## 2014-03-08 ENCOUNTER — Encounter: Payer: Self-pay | Admitting: Family

## 2014-03-08 ENCOUNTER — Ambulatory Visit (INDEPENDENT_AMBULATORY_CARE_PROVIDER_SITE_OTHER): Payer: Commercial Managed Care - HMO | Admitting: Family

## 2014-03-08 VITALS — BP 140/80 | HR 91 | Temp 97.4°F | Resp 16 | Ht 63.25 in | Wt 143.6 lb

## 2014-03-08 DIAGNOSIS — H6690 Otitis media, unspecified, unspecified ear: Secondary | ICD-10-CM | POA: Insufficient documentation

## 2014-03-08 DIAGNOSIS — G2581 Restless legs syndrome: Secondary | ICD-10-CM | POA: Insufficient documentation

## 2014-03-08 DIAGNOSIS — I10 Essential (primary) hypertension: Secondary | ICD-10-CM

## 2014-03-08 DIAGNOSIS — M546 Pain in thoracic spine: Secondary | ICD-10-CM

## 2014-03-08 DIAGNOSIS — H6692 Otitis media, unspecified, left ear: Secondary | ICD-10-CM

## 2014-03-08 DIAGNOSIS — G8929 Other chronic pain: Secondary | ICD-10-CM

## 2014-03-08 DIAGNOSIS — M5489 Other dorsalgia: Secondary | ICD-10-CM

## 2014-03-08 MED ORDER — ATENOLOL 25 MG PO TABS: 25.0000 mg | ORAL_TABLET | Freq: Every morning | ORAL | Status: DC

## 2014-03-08 MED ORDER — ATORVASTATIN CALCIUM 10 MG PO TABS: 10.0000 mg | ORAL_TABLET | Freq: Every evening | ORAL | Status: DC

## 2014-03-08 MED ORDER — OXYCODONE-ACETAMINOPHEN 7.5-325 MG PO TABS: ORAL_TABLET | ORAL | Status: DC

## 2014-03-08 MED ORDER — LANSOPRAZOLE 30 MG PO CPDR: 30.0000 mg | DELAYED_RELEASE_CAPSULE | Freq: Two times a day (BID) | ORAL | Status: DC

## 2014-03-08 MED ORDER — AMOXICILLIN 500 MG PO CAPS: 500.0000 mg | ORAL_CAPSULE | Freq: Three times a day (TID) | ORAL | Status: DC

## 2014-03-08 MED ORDER — LISINOPRIL 10 MG PO TABS: 10.0000 mg | ORAL_TABLET | Freq: Every day | ORAL | Status: DC

## 2014-03-08 MED ORDER — ROPINIROLE HCL 1 MG PO TABS: 1.0000 mg | ORAL_TABLET | Freq: Every day | ORAL | Status: DC

## 2014-03-08 NOTE — Assessment & Plan Note (Signed)
I think that her legs "jumping" in the afternoon may be due to mild withdrawal sxs from taking lower doses of oxycodone during the day. I still encouraged her to take the least dose necessary to control her pain.  Advised her to try taking requip 1 hour later at night to see if this helps.

## 2014-03-08 NOTE — Progress Notes (Signed)
Subjective:    Patient ID: Linda Lane, female    DOB: [birth year], 73 y.o.   MRN: [id number]  HPI  [name] is a 73 yr old female who presents today for follow up.  1) HTN-  Current bp meds include amlodipine, atenolol, lisinopril.  BP Readings from Last 3 Encounters:  03/08/14 140/80  02/22/14 120/80  12/07/13 120/70   2) Chronic low back pain- she is maintained on oxycodone. Had a fall recently at home.  Has been worse since her fall. She is still trying to use 1/2 tab of oxycodone during the day.    3) Nasal congestion- x 3 days. Has associated ear pain.  4) RLS- She [facility]qu[facility] at [facility]. Notes that in the afternoons her legs "get jumpy."   Review of Systems See HPI  Past Medical History  Diagnosis Date  . HTN (hypertension)   . 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 History of gastroesophageal reflux (GERD)   . Peptic ulcer disease   . Hyperlipidemia   . History of colon polyps   . CAD (coronary artery disease)   . SOB (shortness of breath)   . Cancer 2005    history of melanoma face  . Complication of anesthesia 2003 and 2004    woke up during colonscopy  . Ankle edema for last 1 week    both ankles  . Osteoporosis   . Osteoporosis, unspecified 09/19/2012    History   Social History  . Marital Status: Divorced    Spouse Name: N/A    Number of Children: 2  . Years of Education: N/A   Occupational History  . Not on file.   Social History Main Topics  . Smoking status: Current Every Day Smoker -- 0.50 packs/day    Types: Cigarettes  . Smokeless tobacco: Never Used     Comment: 8-10 cigarettes a day  . Alcohol Use: No     Comment: no alcohol since jan 2014  . Drug Use: No  . Sexual Activity: Not on file   Other Topics Concern  . Not on file   Social History Narrative    Past Surgical History  Procedure Laterality Date  . Middle ear surgery Bilateral   . Abdominal hysterectomy    . Cholecystectomy    . Appendectomy     . Bilateral breast implants    . Laparoscopic gastric banding    . Gastric bypass    . Tonsillectomy  1962  . Pacemaker placement  1999, 2007, 2010  . Melanoma excision  2005    beneath chin  . Esophagogastroduodenoscopy N/A 09/14/2012    Procedure: ESOPHAGOGASTRODUODENOSCOPY (EGD);  Surgeon: Milus Banister, MD;  Location: [facility];  Service: Endoscopy;  Laterality: N/A;  . Balloon dilation N/A 09/14/2012    Procedure: BALLOON DILATION;  Surgeon: Milus Banister, MD;  Location: [facility];  Service: Endoscopy;  Laterality: N/A;    Family History  Problem Relation Age of Onset  . Heart attack Mother   . Stroke Mother   . Ulcers Father   . Stroke Father   . Hypertension Brother   . Hypertension Sister   . Heart disease Maternal Grandmother   . Heart disease Maternal Grandfather   . Colon cancer Neg Hx     No Known Allergies  Current Outpatient Prescriptions on File Prior to Visit  Medication Sig Dispense Refill  . amLODipine (NORVASC) 5 MG tablet Take 1 tablet (5 mg total)  by mouth daily. 90 tablet 3  . aspirin 81 MG tablet Take 81 mg by mouth daily.    Marland Kitchen atenolol (TENORMIN) 25 MG tablet Take 1 tablet (25 mg total) by mouth every morning. 90 tablet 1  . atorvastatin (LIPITOR) 10 MG tablet Take 1 tablet (10 mg total) by mouth every evening. 90 tablet 1  . calcium carbonate (OS-CAL) 600 MG TABS Take 500 mg by mouth daily.     . Cholecalciferol (VITAMIN D-3) 1000 UNITS CAPS Take 1,000 Units by mouth daily.    . lansoprazole (PREVACID) 30 MG capsule Take 1 capsule (30 mg total) by mouth 2 (two) times daily. 180 capsule 1  . lisinopril (PRINIVIL,ZESTRIL) 10 MG tablet Take 1 tablet (10 mg total) by mouth daily. 90 tablet 1  . oxyCODONE-acetaminophen (PERCOCET) 7.5-325 MG per tablet 1/2-1 tab every 4 hours as needed for pain. 150 tablet 0  . rOPINIRole (REQUIP) 1 MG tablet Take 1 tablet (1 mg total) by mouth at bedtime. 30 tablet 2   No current facility-administered  medications on file prior to visit.    BP 140/80 mmHg  Pulse 91  Temp(Src) 97.4 F (36.3 C) (Oral)  Resp 16  Ht 5' 3.25" (1.607 m)  Wt 143 lb 9.6 oz (65.137 kg)  BMI 25.22 kg/m2  SpO2 99%  LMP 04/05/1988       Objective:   Physical Exam  Constitutional: She is oriented to person, place, and time. She appears well-developed and well-nourished. No distress.  HENT:  Head: Normocephalic and atraumatic.  R TM is dull. L TM is dull/retracted, mild erythema is noted.   Cardiovascular: Normal rate and regular rhythm.   No murmur heard. Pulmonary/Chest: Effort normal and breath sounds normal. No respiratory distress. She has no wheezes. She has no rales. She exhibits no tenderness.  Musculoskeletal: She exhibits no edema.  Neurological: She is alert and oriented to person, place, and time.  Psychiatric: She has a normal mood and affect. Her behavior is normal. Judgment and thought content normal.          Assessment & Plan:

## 2014-03-08 NOTE — Assessment & Plan Note (Signed)
New.  Will rx with amoxicillin.  

## 2014-03-08 NOTE — Assessment & Plan Note (Signed)
Refill provided of oxycodone, obtain UDS today.

## 2014-03-08 NOTE — Patient Instructions (Signed)
Try taking requip at 9pm instead of 8pm.  Start amoxicillin for ear infection. Call if symptoms worsen or if symptoms do not improve. Follow up in 3 months.

## 2014-03-08 NOTE — Assessment & Plan Note (Signed)
Fair control on current meds. Continue same.

## 2014-03-11 ENCOUNTER — Telehealth: Payer: Self-pay | Admitting: Family

## 2014-03-11 NOTE — Telephone Encounter (Signed)
emmi mailed  °

## 2014-03-14 ENCOUNTER — Telehealth: Payer: Self-pay | Admitting: Family

## 2014-03-14 NOTE — Telephone Encounter (Signed)
Pt was seen 03/08/14.  Stated on Amoxicillin.  Please advise.

## 2014-03-14 NOTE — Telephone Encounter (Signed)
Please advise 

## 2014-03-14 NOTE — Telephone Encounter (Signed)
Caller name: Linda Lane Relation to pt: self Call back number:  [phone] Pharmacy:  Reason for call:   Patient states that her cold symptoms are giving her a little bit of hearing loss and wants to know what she should do about this. She states that it is hard to hear the tv.

## 2014-03-14 NOTE — Telephone Encounter (Signed)
Pt notified and made aware. She agreed with plan and knows to call back for an appointment if no improvement.

## 2014-03-14 NOTE — Telephone Encounter (Signed)
Left a message for call back.  

## 2014-03-14 NOTE — Telephone Encounter (Signed)
Pt should start daily Claritin or Zyrtec to improve congestion and drainage.  Can also start Flonase, 2 sprays each nostril daily to improve congestion.  If no improvement w/ this, will need appt for re-eval

## 2014-04-01 ENCOUNTER — Encounter: Payer: Self-pay | Admitting: Family

## 2014-04-01 ENCOUNTER — Encounter: Payer: Self-pay | Admitting: Internal Medicine

## 2014-04-01 ENCOUNTER — Ambulatory Visit (INDEPENDENT_AMBULATORY_CARE_PROVIDER_SITE_OTHER): Payer: Commercial Managed Care - HMO | Admitting: *Deleted

## 2014-04-01 DIAGNOSIS — R001 Bradycardia, unspecified: Secondary | ICD-10-CM

## 2014-04-01 LAB — MDC_IDC_ENUM_SESS_TYPE_REMOTE
Battery Remaining Longevity: 30 mo
Battery Voltage: 2.74 V
Brady Statistic AP VS Percent: 53 %
Brady Statistic AS VP Percent: 8 %
Date Time Interrogation Session: 20151228163422
Lead Channel Impedance Value: 694 Ohm
Lead Channel Pacing Threshold Amplitude: 1.125 V
Lead Channel Pacing Threshold Pulse Width: 0.4 ms
Lead Channel Sensing Intrinsic Amplitude: 0.7 mV
Lead Channel Setting Pacing Amplitude: 2 V
Lead Channel Setting Pacing Amplitude: 2.5 V
Lead Channel Setting Pacing Pulse Width: 0.4 ms
Lead Channel Setting Sensing Sensitivity: 2 mV
MDC IDC MSMT BATTERY IMPEDANCE: 1977 Ohm
MDC IDC MSMT LEADCHNL RA PACING THRESHOLD AMPLITUDE: 1 V
MDC IDC MSMT LEADCHNL RA PACING THRESHOLD PULSEWIDTH: 0.4 ms
MDC IDC MSMT LEADCHNL RV IMPEDANCE VALUE: 547 Ohm
MDC IDC MSMT LEADCHNL RV SENSING INTR AMPL: 5.6 mV
MDC IDC STAT BRADY AP VP PERCENT: 25 %
MDC IDC STAT BRADY AS VS PERCENT: 14 %

## 2014-04-01 NOTE — Progress Notes (Signed)
Remote pacemaker check. 

## 2014-04-09 ENCOUNTER — Encounter: Payer: Self-pay | Admitting: Cardiology

## 2014-04-09 ENCOUNTER — Telehealth: Payer: Self-pay | Admitting: Family

## 2014-04-09 MED ORDER — OXYCODONE-ACETAMINOPHEN 7.5-325 MG PO TABS: ORAL_TABLET | ORAL | Status: DC

## 2014-04-09 NOTE — Telephone Encounter (Signed)
Rx last printed 03/08/14.  Printed rx and forwarded to Provider for signature.

## 2014-04-09 NOTE — Telephone Encounter (Signed)
Rx placed at front desk for pick up and pt has been notified. 

## 2014-04-09 NOTE — Telephone Encounter (Signed)
Caller name:Prestwood, Leshia Relation to LM:RAJH Call back [place]:  Reason for call: pt is needing rx oxyCODONE-acetaminophen (PERCOCET) 7.5-325 MG please call when available for pick up

## 2014-05-09 ENCOUNTER — Telehealth: Payer: Self-pay | Admitting: Family

## 2014-05-09 NOTE — Telephone Encounter (Signed)
Caller name:Mcquerry, Amabel Relation to CN:OBSJ Call back [place]:  Reason for call: pt is needing rx oxyCODONE-acetaminophen (PERCOCET) 7.5-325 MG per tablet, please call when available and ready for pick up

## 2014-05-09 NOTE — Telephone Encounter (Signed)
Last filled:  04/09/14 Amt: 150, 0 refills Last OV:  03/08/14 UDS--MODERATE risk No contract on file  Please advise.

## 2014-05-10 MED ORDER — OXYCODONE-ACETAMINOPHEN 7.5-325 MG PO TABS: ORAL_TABLET | ORAL | Status: DC

## 2014-05-10 NOTE — Telephone Encounter (Signed)
Rx placed at front desk and pt notified.

## 2014-05-10 NOTE — Telephone Encounter (Signed)
rx contract is on file and ws signed 6/14. See rx.

## 2014-05-13 ENCOUNTER — Telehealth: Payer: Self-pay | Admitting: *Deleted

## 2014-05-13 NOTE — Telephone Encounter (Signed)
-----   Message from Ronny Flurry, [place] sent at 12/07/2013  2:32 PM EDT ----- Next Prolia due 06/08/2014.    Start insurance verification in February 2016.

## 2014-05-13 NOTE — Telephone Encounter (Signed)
Rose-- can you help with Prolia verification? She is due 06/08/14. Thanks!

## 2014-05-14 ENCOUNTER — Telehealth: Payer: Self-pay | Admitting: *Deleted

## 2014-05-14 NOTE — Telephone Encounter (Signed)
I have electronically submitted pt's info for Prolia insurance verification and will notify you once I have a response. Thank you. °

## 2014-05-14 NOTE — Telephone Encounter (Signed)
Lansoprazole prior authorization initiated. Awaiting determination through [facility]. JG//CMA

## 2014-05-24 NOTE — Telephone Encounter (Signed)
Summary of benefits received. Admin and Prolia are subject to 20% co-insurance up to $6700 out of pocket max ($0 met). $10 copay will apply if office visit is billed.  Also notes that pt may be eligible for Prolia copay program. She may contact [phone] to apply.  Attempted to reach pt and left message for her to return my call.

## 2014-05-24 NOTE — Telephone Encounter (Signed)
Notified pt. She has follow up with Provider on 06/07/14 and will get injection at that time. Gave pt # below to apply for copay program.

## 2014-05-29 NOTE — Telephone Encounter (Signed)
PA approved.

## 2014-06-05 NOTE — Telephone Encounter (Signed)
I have faxed the [facility] p/a form to your attn.  Thank you for your assistance!

## 2014-06-05 NOTE — Telephone Encounter (Signed)
Notified pt. She will keep appt on 06/07/14 as she has other medical conditions that we will follow up on. We will call her once Prolia authorization is obtained.

## 2014-06-05 NOTE — Telephone Encounter (Signed)
Pt needs approval/authorization # from [facility] prior to her injection.  I have completed most of the [facility] prior authorization form, but there are a couple sections Dr. Charlett Blake needs to complete and sign.  Once it's complete you can return it to my attn via fax [phone].  [employer] is not going to have an authorization in place by 06/07/2014, usually takes them a couple weeks from the time I send it to them so I don't know if you want to r/s pt's apptmt or not.  Do I fax the p/a form  to [phone]? Thank you.

## 2014-06-05 NOTE — Telephone Encounter (Signed)
Rose-- you can fax it to [phone] attn: Gilmore Laroche and I will be looking for it. Thank you!

## 2014-06-07 ENCOUNTER — Ambulatory Visit (INDEPENDENT_AMBULATORY_CARE_PROVIDER_SITE_OTHER): Payer: Commercial Managed Care - HMO | Admitting: Family

## 2014-06-07 ENCOUNTER — Encounter: Payer: Self-pay | Admitting: Family

## 2014-06-07 VITALS — BP 140/94 | HR 85 | Temp 97.8°F | Resp 16 | Ht 63.25 in | Wt 143.4 lb

## 2014-06-07 DIAGNOSIS — M5489 Other dorsalgia: Secondary | ICD-10-CM

## 2014-06-07 DIAGNOSIS — I10 Essential (primary) hypertension: Secondary | ICD-10-CM

## 2014-06-07 DIAGNOSIS — G8929 Other chronic pain: Secondary | ICD-10-CM

## 2014-06-07 DIAGNOSIS — Z1231 Encounter for screening mammogram for malignant neoplasm of breast: Secondary | ICD-10-CM

## 2014-06-07 DIAGNOSIS — R0789 Other chest pain: Secondary | ICD-10-CM | POA: Insufficient documentation

## 2014-06-07 DIAGNOSIS — Z1239 Encounter for other screening for malignant neoplasm of breast: Secondary | ICD-10-CM

## 2014-06-07 DIAGNOSIS — M546 Pain in thoracic spine: Secondary | ICD-10-CM

## 2014-06-07 MED ORDER — ATENOLOL 50 MG PO TABS: 50.0000 mg | ORAL_TABLET | Freq: Every day | ORAL | Status: DC

## 2014-06-07 MED ORDER — OXYCODONE-ACETAMINOPHEN 7.5-325 MG PO TABS: ORAL_TABLET | ORAL | Status: DC

## 2014-06-07 NOTE — Progress Notes (Signed)
Subjective:    Patient ID: Linda Lane, female    DOB: May 04, 1940, 74 y.o.   MRN: [id number]  HPI  Linda Lane is a 74 yr old female who presents today for follow up.  1) HTN- on lisinopril amlodipine and atenolol.  BP Readings from Last 3 Encounters:  06/07/14 140/94  03/08/14 140/80  02/22/14 120/80  manual bp recheck 142/100. Denies dizziness/headaches.   2) Chronic back pain- unchanged. She reports little pain at rest but exacerbated by pain. She is requesting refill of percocet today.  3) Chest pain- reports that this has been occuring x 6 weeks, only occurs at night when she is laying on the right side. She has a pacemaker and breast implants. Wakes her up at night. Radiates from right breast to her back.  Sitting up relieves the pain.  Denies associated nausea/sob/diaphoresis with these episodes. Has pt with EP in the end of march. Missed cardiology visit in January due to finances.      Review of Systems Past Medical History  Diagnosis Date  . HTN (hypertension)   . 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 History of gastroesophageal reflux (GERD)   . Peptic ulcer disease   . Hyperlipidemia   . History of colon polyps   . CAD (coronary artery disease)   . SOB (shortness of breath)   . Cancer 2005    history of melanoma face  . Complication of anesthesia 2003 and 2004    woke up during colonscopy  . Ankle edema for last 1 week    both ankles  . Osteoporosis   . Osteoporosis, unspecified 09/19/2012    History   Social History  . Marital Status: Divorced    Spouse Name: N/A  . Number of Children: 2  . Years of Education: N/A   Occupational History  . Not on file.   Social History Main Topics  . Smoking status: Current Every Day Smoker -- 0.50 packs/day    Types: Cigarettes  . Smokeless tobacco: Never Used     Comment: 8-10 cigarettes a day  . Alcohol Use: No     Comment: no alcohol since jan 2014  . Drug Use: No  . Sexual  Activity: Not on file   Other Topics Concern  . Not on file   Social History Narrative    Past Surgical History  Procedure Laterality Date  . Middle ear surgery Bilateral   . Abdominal hysterectomy    . Cholecystectomy    . Appendectomy    . Bilateral breast implants    . Laparoscopic gastric banding    . Gastric bypass    . Tonsillectomy  1962  . Pacemaker placement  1999, 2007, 2010  . Melanoma excision  2005    beneath chin  . Esophagogastroduodenoscopy N/A 09/14/2012    Procedure: ESOPHAGOGASTRODUODENOSCOPY (EGD);  Surgeon: Milus Banister, MD;  Location: [facility];  Service: Endoscopy;  Laterality: N/A;  . Balloon dilation N/A 09/14/2012    Procedure: BALLOON DILATION;  Surgeon: Milus Banister, MD;  Location: [facility];  Service: Endoscopy;  Laterality: N/A;    Family History  Problem Relation Age of Onset  . Heart attack Mother   . Stroke Mother   . Ulcers Father   . Stroke Father   . Hypertension Brother   . Hypertension Sister   . Heart disease Maternal Grandmother   . Heart disease Maternal Grandfather   . Colon cancer Neg Hx  No Known Allergies  Current Outpatient Prescriptions on File Prior to Visit  Medication Sig Dispense Refill  . amLODipine (NORVASC) 5 MG tablet Take 1 tablet (5 mg total) by mouth daily. 90 tablet 3  . amoxicillin (AMOXIL) 500 MG capsule Take 1 capsule (500 mg total) by mouth 3 (three) times daily. 30 capsule 0  . aspirin 81 MG tablet Take 81 mg by mouth daily.    Marland Kitchen atenolol (TENORMIN) 25 MG tablet Take 1 tablet (25 mg total) by mouth every morning. 90 tablet 1  . atorvastatin (LIPITOR) 10 MG tablet Take 1 tablet (10 mg total) by mouth every evening. 90 tablet 1  . calcium carbonate (OS-CAL) 600 MG TABS Take 500 mg by mouth daily.     . Cholecalciferol (VITAMIN D-3) 1000 UNITS CAPS Take 1,000 Units by mouth daily.    . lansoprazole (PREVACID) 30 MG capsule Take 1 capsule (30 mg total) by mouth 2 (two) times daily. 180 capsule  1  . lisinopril (PRINIVIL,ZESTRIL) 10 MG tablet Take 1 tablet (10 mg total) by mouth daily. 90 tablet 1  . oxyCODONE-acetaminophen (PERCOCET) 7.5-325 MG per tablet 1/2-1 tab every 4 hours as needed for pain. 150 tablet 0  . rOPINIRole (REQUIP) 1 MG tablet Take 1 tablet (1 mg total) by mouth at bedtime. 90 tablet 1   No current facility-administered medications on file prior to visit.    BP 140/94 mmHg  Pulse 85  Temp(Src) 97.8 F (36.6 C) (Oral)  Resp 16  Ht 5' 3.25" (1.607 m)  Wt 143 lb 6.4 oz (65.046 kg)  BMI 25.19 kg/m2  SpO2 97%  LMP 04/05/1988       Objective:   Physical Exam  Constitutional: She appears well-developed and well-nourished.  HENT:  Head: Normocephalic and atraumatic.  Cardiovascular: Normal rate, regular rhythm and normal heart sounds.   No murmur heard. Pulmonary/Chest: Effort normal and breath sounds normal. No respiratory distress. She has no wheezes.  Calcified breast implants bilaterally, but no obvious breast masses PPM- noted beneath skin on the right   Musculoskeletal: She exhibits no edema.  Psychiatric: She has a normal mood and affect. Her behavior is normal. Judgment and thought content normal.          Assessment & Plan:

## 2014-06-07 NOTE — Assessment & Plan Note (Signed)
Right sided. Advised pt to keep EP follow up, reschedule cardiology visit.  Will obtain routine mammogram. EKG is performed today and does not show any acute ischemia, however it appears different from prior. I have forwarded ekg to her EP physician for review.

## 2014-06-07 NOTE — Assessment & Plan Note (Signed)
Uncontrolled, increase atenolol, continue amlodipine and lisinopril. Follow up in 1 month.

## 2014-06-07 NOTE — Patient Instructions (Addendum)
Increase atenolol from 25mg  to 50mg  once daily. Please schedule a follow up appointment in 1 month. You will be contacted about your mammogram. Please arrange follow up with your cardiologist. Go to the ER if you develop worsening chest pain that does not resolve on its own. Follow up with [place] in 3 months.

## 2014-06-07 NOTE — Telephone Encounter (Signed)
PA form received and forwarded to Provider for completion / signature.

## 2014-06-07 NOTE — Assessment & Plan Note (Signed)
Stable, continue percocet, refill provided.

## 2014-06-07 NOTE — Progress Notes (Signed)
Pre visit review using our clinic review tool, if applicable. No additional management support is needed unless otherwise documented below in the visit note. 

## 2014-06-14 ENCOUNTER — Ambulatory Visit (HOSPITAL_BASED_OUTPATIENT_CLINIC_OR_DEPARTMENT_OTHER): Payer: Commercial Managed Care - HMO

## 2014-06-20 NOTE — Telephone Encounter (Signed)
I have sent the completed p/a form along w/clinicals from DOS 09/27/2012 to [facility] and will let you know once I rec approval/denial info. Thank you.

## 2014-06-24 ENCOUNTER — Other Ambulatory Visit: Payer: Self-pay | Admitting: Cardiovascular Disease

## 2014-06-24 ENCOUNTER — Telehealth: Payer: Self-pay | Admitting: *Deleted

## 2014-06-24 DIAGNOSIS — I10 Essential (primary) hypertension: Secondary | ICD-10-CM

## 2014-06-24 MED ORDER — AMLODIPINE BESYLATE 5 MG PO TABS: 5.0000 mg | ORAL_TABLET | Freq: Every day | ORAL | Status: DC

## 2014-06-24 NOTE — Telephone Encounter (Signed)
Received refill request from [facility] for amlodipine refill. Refill sent.

## 2014-07-04 ENCOUNTER — Encounter: Payer: Self-pay | Admitting: *Deleted

## 2014-07-09 ENCOUNTER — Other Ambulatory Visit: Payer: Self-pay | Admitting: Family

## 2014-07-09 ENCOUNTER — Encounter: Payer: Self-pay | Admitting: Family

## 2014-07-09 ENCOUNTER — Ambulatory Visit (INDEPENDENT_AMBULATORY_CARE_PROVIDER_SITE_OTHER): Payer: Commercial Managed Care - HMO | Admitting: Family

## 2014-07-09 VITALS — BP 144/80 | HR 91 | Temp 98.1°F | Resp 18 | Ht 63.25 in | Wt 146.0 lb

## 2014-07-09 DIAGNOSIS — I10 Essential (primary) hypertension: Secondary | ICD-10-CM

## 2014-07-09 DIAGNOSIS — R0789 Other chest pain: Secondary | ICD-10-CM

## 2014-07-09 DIAGNOSIS — Z1231 Encounter for screening mammogram for malignant neoplasm of breast: Secondary | ICD-10-CM

## 2014-07-09 MED ORDER — OXYCODONE-ACETAMINOPHEN 7.5-325 MG PO TABS: ORAL_TABLET | ORAL | Status: DC

## 2014-07-09 NOTE — Progress Notes (Signed)
Subjective:    Patient ID: Linda Lane, female    DOB: [birth year], 74 y.o.   MRN: [id number]  HPI  Patient is currently maintained on the following medications for blood pressure: atenolol Patient reports good compliance with blood pressure medications. Patient denies chest pain, shortness of breath or swelling. Last 3 blood pressure readings in our office are as follows:  BP Readings from Last 3 Encounters:  07/09/14 144/80  06/07/14 140/94  03/08/14 140/80   She has not scheduled cardiology appointment due to finances but plans to do so later this month.  She continues to have intermittent right sided chest pain- worst at night when she lays on her right side.   Review of Systems    see HPI  Past Medical History  Diagnosis Date  . HTN (hypertension)   . 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 History of gastroesophageal reflux (GERD)   . Peptic ulcer disease   . Hyperlipidemia   . History of colon polyps   . CAD (coronary artery disease)   . SOB (shortness of breath)   . Cancer 2005    history of melanoma face  . Complication of anesthesia 2003 and 2004    woke up during colonscopy  . Ankle edema for last 1 week    both ankles  . Osteoporosis   . Osteoporosis, unspecified 09/19/2012    History   Social History  . Marital Status: Divorced    Spouse Name: N/A  . Number of Children: 2  . Years of Education: N/A   Occupational History  . Not on file.   Social History Main Topics  . Smoking status: Current Every Day Smoker -- 0.50 packs/day    Types: Cigarettes  . Smokeless tobacco: Never Used     Comment: 8-10 cigarettes a day  . Alcohol Use: No     Comment: no alcohol since jan 2014  . Drug Use: No  . Sexual Activity: Not on file   Other Topics Concern  . Not on file   Social History Narrative    Past Surgical History  Procedure Laterality Date  . Middle ear surgery Bilateral   . Abdominal hysterectomy    . Cholecystectomy     . Appendectomy    . Bilateral breast implants    . Laparoscopic gastric banding    . Gastric bypass    . Tonsillectomy  1962  . Pacemaker placement  1999, 2007, 2010  . Melanoma excision  2005    beneath chin  . Esophagogastroduodenoscopy N/A 09/14/2012    Procedure: ESOPHAGOGASTRODUODENOSCOPY (EGD);  Surgeon: Milus Banister, MD;  Location: [facility];  Service: Endoscopy;  Laterality: N/A;  . Balloon dilation N/A 09/14/2012    Procedure: BALLOON DILATION;  Surgeon: Milus Banister, MD;  Location: [facility];  Service: Endoscopy;  Laterality: N/A;    Family History  Problem Relation Age of Onset  . Heart attack Mother   . Stroke Mother   . Ulcers Father   . Stroke Father   . Hypertension Brother   . Hypertension Sister   . Heart disease Maternal Grandmother   . Heart disease Maternal Grandfather   . Colon cancer Neg Hx     No Known Allergies  Current Outpatient Prescriptions on File Prior to Visit  Medication Sig Dispense Refill  . amLODipine (NORVASC) 5 MG tablet Take 1 tablet (5 mg total) by mouth daily. 90 tablet 1  . aspirin 81 MG tablet  Take 81 mg by mouth daily.    Marland Kitchen atenolol (TENORMIN) 50 MG tablet Take 1 tablet (50 mg total) by mouth daily. 30 tablet 3  . atorvastatin (LIPITOR) 10 MG tablet Take 1 tablet (10 mg total) by mouth every evening. 90 tablet 1  . calcium carbonate (OS-CAL) 600 MG TABS Take 500 mg by mouth daily.     . Cholecalciferol (VITAMIN D-3) 1000 UNITS CAPS Take 1,000 Units by mouth daily.    . lansoprazole (PREVACID) 30 MG capsule Take 1 capsule (30 mg total) by mouth 2 (two) times daily. 180 capsule 1  . lisinopril (PRINIVIL,ZESTRIL) 10 MG tablet Take 1 tablet (10 mg total) by mouth daily. 90 tablet 1  . oxyCODONE-acetaminophen (PERCOCET) 7.5-325 MG per tablet 1/2-1 tab every 4 hours as needed for pain. 150 tablet 0  . rOPINIRole (REQUIP) 1 MG tablet Take 1 tablet (1 mg total) by mouth at bedtime. 90 tablet 1   No current facility-administered  medications on file prior to visit.    BP 144/80 mmHg  Pulse 91  Temp(Src) 98.1 F (36.7 C) (Oral)  Resp 18  Ht 5' 3.25" (1.607 m)  Wt 146 lb (66.225 kg)  BMI 25.64 kg/m2  SpO2 99%  LMP 04/05/1988    Objective:   Physical Exam  Constitutional: She appears well-developed and well-nourished.  Cardiovascular: Normal rate, regular rhythm and normal heart sounds.   No murmur heard. Pulmonary/Chest: Effort normal and breath sounds normal. No respiratory distress. She has no wheezes.  Psychiatric: She has a normal mood and affect. Her behavior is normal. Judgment and thought content normal.          Assessment & Plan:

## 2014-07-09 NOTE — Assessment & Plan Note (Signed)
Fair bp on current meds, continue same.

## 2014-07-09 NOTE — Progress Notes (Signed)
Pre visit review using our clinic review tool, if applicable. No additional management support is needed unless otherwise documented below in the visit note. 

## 2014-07-09 NOTE — Patient Instructions (Signed)
Continue your current blood pressure medications. Please schedule mammogram on the first floor and arrange follow up with Dr. Lovena Le.

## 2014-07-09 NOTE — Telephone Encounter (Signed)
Linda Lane--Pt is in the office today saying she received a letter of approval for Prolia but I do not see that we have received this yet.  Can you verify?

## 2014-07-09 NOTE — Telephone Encounter (Signed)
Spoke with pt and scheduled nurse visit for 07/12/14 at 10:45am.

## 2014-07-09 NOTE — Telephone Encounter (Signed)
Yes ma'am you are correct.  I did not see it when I looked see if there was one there, my sincerest apologies to you and the patient.  Pt's p/a for Prolia is effective 05/04/2014 - 05/04/2015, Josem Kaufmann #[id number].  Admin and Prolia are subject to 20% co-insurance w/out an OV, which means pt's estimated responsibility is $180; if an OV is billed, there will be an additional $10 co-pay, making pt's estimated total responsibility $190.  I have sent a copy of the summary of benefits to be scanned into her chart.  If pt cannot afford $180-$190 for her injection, please advise her to contact [employer] at [phone] to see if she qualifies for one of their financial assistance programs.  If she qualifies they will instruct her how to proceed.  If you have any questions, please let me know. Thank you.

## 2014-07-09 NOTE — Telephone Encounter (Signed)
Rose-- I am a little confused. There is an approval letter from [facility] for [place] covering dates of service from 05/03/13 through 05/04/15. It is scanned into Media tab dated 05/31/13. Doesn't this mean approval is still valid and pt can get injection? Thanks.

## 2014-07-09 NOTE — Telephone Encounter (Signed)
I apologize for the delay in responding to this, but I just now saw the message.  Who sent her a letter of approval?  We do not have anything yet.  I sent her info to [facility] 06/20/2014, but have not heard back from them (they are usually slow to respond). Thank you.

## 2014-07-09 NOTE — Assessment & Plan Note (Signed)
Advised pt to call EP to arrange follow up.  I think that her pain is due to PPM.

## 2014-07-12 ENCOUNTER — Ambulatory Visit (HOSPITAL_BASED_OUTPATIENT_CLINIC_OR_DEPARTMENT_OTHER)
Admission: RE | Admit: 2014-07-12 | Discharge: 2014-07-12 | Disposition: A | Discharge status: Home / Self Care | Payer: Commercial Managed Care - HMO | Source: Ambulatory Visit | Attending: Family | Admitting: Family

## 2014-07-12 ENCOUNTER — Ambulatory Visit (INDEPENDENT_AMBULATORY_CARE_PROVIDER_SITE_OTHER): Payer: Commercial Managed Care - HMO | Admitting: *Deleted

## 2014-07-12 DIAGNOSIS — M810 Age-related osteoporosis without current pathological fracture: Secondary | ICD-10-CM | POA: Diagnosis not present

## 2014-07-12 DIAGNOSIS — Z1231 Encounter for screening mammogram for malignant neoplasm of breast: Secondary | ICD-10-CM | POA: Diagnosis present

## 2014-07-12 MED ORDER — DENOSUMAB 60 MG/ML ~~LOC~~ SOLN
60.0000 mg | Freq: Once | SUBCUTANEOUS | Status: AC
  Administered 2014-07-12: 60 mg via SUBCUTANEOUS

## 2014-07-12 NOTE — Progress Notes (Signed)
Pre visit review using our clinic review tool, if applicable. No additional management support is needed unless otherwise documented below in the visit note.  Patient stated she was aware of charges and had made arrangements for payment.    Patient tolerated injection well.

## 2014-08-06 ENCOUNTER — Telehealth: Payer: Self-pay | Admitting: Family

## 2014-08-06 DIAGNOSIS — M545 Low back pain: Secondary | ICD-10-CM

## 2014-08-06 MED ORDER — OXYCODONE-ACETAMINOPHEN 7.5-325 MG PO TABS: ORAL_TABLET | ORAL | Status: DC

## 2014-08-06 NOTE — Telephone Encounter (Signed)
Relation to pt: self  Call back number:[phone]   Reason for call:  Pt requesting a refill oxyCODONE-acetaminophen (PERCOCET) 7.5-325 MG per

## 2014-08-06 NOTE — Telephone Encounter (Signed)
Last Rx 07/09/14. Pt has f/u 10/08/14.  UDS on file. Rx forwarded to Provider for signature.

## 2014-08-07 MED ORDER — OXYCODONE-ACETAMINOPHEN 7.5-325 MG PO TABS: 1.0000 | ORAL_TABLET | Freq: Four times a day (QID) | ORAL | Status: DC | PRN

## 2014-08-07 NOTE — Telephone Encounter (Signed)
They do accept her insurance, are you requesting a referral there? If so, please place order.

## 2014-08-07 NOTE — Telephone Encounter (Signed)
Rx placed at front desk for pick up. Notified pt of direction change and she voices understanding.

## 2014-08-07 NOTE — Telephone Encounter (Signed)
I reviewed her oxycodone rx. She is currently using every 4 hrs 150 tabs per month.  This is really too much medicine.  I would like her to cut back to 1 tab every 6 hours #120, rx reprinted.   Anderson Malta, could you please call [facility] and see if they accept her insurance? thanks

## 2014-08-07 NOTE — Telephone Encounter (Signed)
Anderson Malta, could you please let pt know that we are initiating this referral. Thanks.

## 2014-08-13 ENCOUNTER — Encounter: Payer: Self-pay | Admitting: Cardiology

## 2014-08-13 ENCOUNTER — Ambulatory Visit (INDEPENDENT_AMBULATORY_CARE_PROVIDER_SITE_OTHER): Payer: Commercial Managed Care - HMO | Admitting: Cardiology

## 2014-08-13 ENCOUNTER — Other Ambulatory Visit: Payer: Self-pay | Admitting: Cardiology

## 2014-08-13 VITALS — BP 124/78 | HR 80 | Ht 63.0 in | Wt 147.0 lb

## 2014-08-13 DIAGNOSIS — Z950 Presence of cardiac pacemaker: Secondary | ICD-10-CM | POA: Diagnosis not present

## 2014-08-13 DIAGNOSIS — I739 Peripheral vascular disease, unspecified: Secondary | ICD-10-CM

## 2014-08-13 DIAGNOSIS — I2510 Atherosclerotic heart disease of native coronary artery without angina pectoris: Secondary | ICD-10-CM

## 2014-08-13 DIAGNOSIS — I10 Essential (primary) hypertension: Secondary | ICD-10-CM

## 2014-08-13 DIAGNOSIS — E785 Hyperlipidemia, unspecified: Secondary | ICD-10-CM

## 2014-08-13 NOTE — Progress Notes (Signed)
08/13/2014 Linda Lane   [birth year]  [id number]  Primary Physician Nance Pear., NP Primary Cardiologist: Dr Burt Knack   HPI: 74 y/o year-old woman presents today to establish cardiac care. She has lived in [place] and [place], and recently has relocated to [place] to be closer to family. Her daughter in law works in the cath lab. The patient has a history of nonobstructive coronary artery disease. She underwent intravascular ultrasound of the right coronary artery in 2011 after an abnormal Myoview, demonstrating a 50% stenosis. She's been managed medically. She also has a long-standing history of hypertension and an echo in 2011 has shown decreased left ventricular compliance. She has asymptomatic mild carotid stenosis with bilateral 1-39% ICA stenosis by doppler Dec 2014. Finally, she's had a history of atrial fibrillation with permanent pacemaker placement in 2009 and redo pacemaker placement in 2010. She is here for a one year follow up. She denies any chest pain or palpitations.    Current Outpatient Prescriptions  Medication Sig Dispense Refill  . amLODipine (NORVASC) 5 MG tablet Take 1 tablet (5 mg total) by mouth daily. 90 tablet 1  . aspirin 81 MG tablet Take 81 mg by mouth daily.    Marland Kitchen atenolol (TENORMIN) 50 MG tablet Take 1 tablet (50 mg total) by mouth daily. 30 tablet 3  . atorvastatin (LIPITOR) 10 MG tablet Take 1 tablet (10 mg total) by mouth every evening. 90 tablet 1  . calcium carbonate (OS-CAL) 600 MG TABS Take 500 mg by mouth daily.     . Cholecalciferol (VITAMIN D-3) 1000 UNITS CAPS Take 1,000 Units by mouth daily.    . lansoprazole (PREVACID) 30 MG capsule Take 1 capsule (30 mg total) by mouth 2 (two) times daily. 180 capsule 1  . lisinopril (PRINIVIL,ZESTRIL) 10 MG tablet Take 1 tablet (10 mg total) by mouth daily. 90 tablet 1  . oxyCODONE-acetaminophen (PERCOCET) 7.5-325 MG per tablet Take 1 tablet by mouth every 6 (six) hours as needed. (Patient taking  differently: Take 1 tablet by mouth every 6 (six) hours as needed (pain). ) 120 tablet 0  . rOPINIRole (REQUIP) 1 MG tablet Take 1 tablet (1 mg total) by mouth at bedtime. 90 tablet 1   No current facility-administered medications for this visit.    No Known Allergies  History   Social History  . Marital Status: Divorced    Spouse Name: N/A  . Number of Children: 2  . Years of Education: N/A   Occupational History  . Not on file.   Social History Main Topics  . Smoking status: Current Every Day Smoker -- 0.50 packs/day    Types: Cigarettes  . Smokeless tobacco: Never Used     Comment: 8-10 cigarettes a day  . Alcohol Use: No     Comment: no alcohol since jan 2014  . Drug Use: No  . Sexual Activity: Not on file   Other Topics Concern  . Not on file   Social History Narrative     Review of Systems: General: negative for chills, fever, night sweats or weight changes.  Cardiovascular: negative for chest pain, dyspnea on exertion, edema, orthopnea, palpitations, paroxysmal nocturnal dyspnea or shortness of breath Dermatological: negative for rash Respiratory: negative for cough or wheezing Urologic: negative for hematuria Abdominal: negative for nausea, vomiting, diarrhea, bright red blood per rectum, melena, or hematemesis Neurologic: negative for visual changes, syncope, or dizziness All other systems reviewed and are otherwise negative except as noted above.    Blood  pressure 124/78, pulse 80, height 5\' 3"  (1.6 m), weight 147 lb (66.679 kg), last menstrual period 04/05/1988.  General appearance: alert, cooperative and no distress Neck: no carotid bruit and no JVD Lungs: clear to auscultation bilaterally Heart: regular rate and rhythm Abdomen: soft, non-tender; bowel sounds normal; no masses,  no organomegaly Extremities: trace edema Skin: Skin color, texture, turgor normal. No rashes or lesions Neurologic: Grossly normal   ASSESSMENT AND PLAN:   CAD (coronary  artery disease) Non obstructive disease in 2011. No angina   Hyperlipidemia Due for lipids and Cmet   HTN (hypertension) Controlled   PPM-Medtronic Initial pacemaker in 2009, changed in 2010.   PVD (peripheral vascular disease) Mild, asymptomatic carotid disease by doppler.      PLAN  She is due for lipids and a Cmet. She has an appointment with Dr Lovena Le in two weeks. We'll see her back in a year or sooner if any problems arise.   Hinley Brimage KPA-C 08/13/2014 10:54 AM

## 2014-08-13 NOTE — Assessment & Plan Note (Signed)
Controlled.  

## 2014-08-13 NOTE — Assessment & Plan Note (Signed)
Due for lipids and Cmet

## 2014-08-13 NOTE — Assessment & Plan Note (Signed)
Mild, asymptomatic carotid disease by doppler.

## 2014-08-13 NOTE — Assessment & Plan Note (Signed)
Non obstructive disease in 2011. No angina

## 2014-08-13 NOTE — Patient Instructions (Addendum)
Medication Instructions:   Labwork:  CMET AND FASTING LIPIDS WITH VISIT TO PCP  Testing/Procedures:   Follow-Up:  Your physician wants you to follow-up in:  [place] will receive a reminder letter in the mail two months in advance. If you don't receive a letter, please call our office to schedule the follow-up appointment.    Any Other Special Instructions Will Be Listed Below (If Applicable).

## 2014-08-13 NOTE — Assessment & Plan Note (Signed)
Initial pacemaker in 2009, changed in 2010.

## 2014-08-20 ENCOUNTER — Telehealth: Payer: Self-pay | Admitting: Family

## 2014-08-20 NOTE — Telephone Encounter (Signed)
Will forward to [place].  All I see is where I billed a standard level 3 visit. I am not a specialist and therefore should be no reason it could be billed that way.

## 2014-08-20 NOTE — Telephone Encounter (Signed)
Notified pt that we are looking into this and will notify her once we have further information. Pt ok and voices understanding.

## 2014-08-20 NOTE — Telephone Encounter (Signed)
Pt called stating we billed as a specialist for date of service 02/22/2014. Pt seen by Leeanne Rio PA. Advised pt to call billing dept. She said she has and they have even reviewed her case and informed her she owes an additional "specialist" copayment. Pt states her insurance company told her it is a billing error.

## 2014-08-21 NOTE — Telephone Encounter (Signed)
email sent to billing office to resolved this issue

## 2014-08-21 NOTE — Telephone Encounter (Signed)
Referral faxed to [facility] awaiting appt

## 2014-08-29 ENCOUNTER — Telehealth: Payer: Self-pay | Admitting: Family

## 2014-08-29 NOTE — Telephone Encounter (Signed)
Caller name: Dalyah Pla Relationship to patient: self Can be reached: [phone]  Reason for call: Pt asking if she can get a small refill on oxyCODONE-acetaminophen (PERCOCET) 7.5-325 MG per tablet. Pt will be out Sunday. Taking 5 most days still. Trying to get to only 4/day. Appt with pain mgmt not until 09/09/14. Please call when Rx is ready to pick up.

## 2014-08-30 NOTE — Telephone Encounter (Signed)
Patient calling in regarding this.  °

## 2014-08-30 NOTE — Telephone Encounter (Signed)
She is written for one tab every 6 hours= 4 tabs per day. We cannot provide early refills.

## 2014-08-30 NOTE — Telephone Encounter (Signed)
Notified pt. 

## 2014-09-03 ENCOUNTER — Encounter: Payer: Self-pay | Admitting: Internal Medicine

## 2014-09-03 ENCOUNTER — Ambulatory Visit (INDEPENDENT_AMBULATORY_CARE_PROVIDER_SITE_OTHER): Payer: Commercial Managed Care - HMO | Admitting: Internal Medicine

## 2014-09-03 ENCOUNTER — Other Ambulatory Visit (INDEPENDENT_AMBULATORY_CARE_PROVIDER_SITE_OTHER): Payer: Commercial Managed Care - HMO

## 2014-09-03 VITALS — BP 108/72 | HR 89 | Ht 63.5 in | Wt 145.8 lb

## 2014-09-03 DIAGNOSIS — I2510 Atherosclerotic heart disease of native coronary artery without angina pectoris: Secondary | ICD-10-CM | POA: Diagnosis not present

## 2014-09-03 DIAGNOSIS — E785 Hyperlipidemia, unspecified: Secondary | ICD-10-CM | POA: Diagnosis not present

## 2014-09-03 DIAGNOSIS — Z950 Presence of cardiac pacemaker: Secondary | ICD-10-CM | POA: Diagnosis not present

## 2014-09-03 DIAGNOSIS — R001 Bradycardia, unspecified: Secondary | ICD-10-CM

## 2014-09-03 DIAGNOSIS — I10 Essential (primary) hypertension: Secondary | ICD-10-CM | POA: Diagnosis not present

## 2014-09-03 DIAGNOSIS — I2583 Coronary atherosclerosis due to lipid rich plaque: Secondary | ICD-10-CM

## 2014-09-03 LAB — CUP PACEART INCLINIC DEVICE CHECK
Battery Voltage: 2.73 V
Brady Statistic AP VS Percent: 54 %
Brady Statistic AS VS Percent: 14 %
Lead Channel Impedance Value: 526 Ohm
Lead Channel Impedance Value: 729 Ohm
Lead Channel Pacing Threshold Amplitude: 1 V
Lead Channel Pacing Threshold Pulse Width: 0.4 ms
Lead Channel Pacing Threshold Pulse Width: 0.4 ms
Lead Channel Sensing Intrinsic Amplitude: 1.4 mV
Lead Channel Setting Pacing Amplitude: 2.5 V
Lead Channel Setting Pacing Pulse Width: 0.4 ms
Lead Channel Setting Sensing Sensitivity: 1 mV
MDC IDC MSMT BATTERY IMPEDANCE: 2766 Ohm
MDC IDC MSMT BATTERY REMAINING LONGEVITY: 20 mo
MDC IDC MSMT LEADCHNL RA PACING THRESHOLD AMPLITUDE: 1.25 V
MDC IDC MSMT LEADCHNL RA SENSING INTR AMPL: 1 mV
MDC IDC SESS DTM: 20160531140223
MDC IDC SET LEADCHNL RV PACING AMPLITUDE: 2.5 V
MDC IDC STAT BRADY AP VP PERCENT: 25 %
MDC IDC STAT BRADY AS VP PERCENT: 8 %

## 2014-09-03 NOTE — Assessment & Plan Note (Signed)
Her medtronic DDD PM is working normally except for some far field over-sensing. Will recheck in several months.

## 2014-09-03 NOTE — Assessment & Plan Note (Signed)
She denies anginal symptoms. I have encouraged the patient to increase her physical activity but she is limited by back pain.

## 2014-09-03 NOTE — Assessment & Plan Note (Signed)
Her blood pressure is not elevated. She will continue her current meds.

## 2014-09-03 NOTE — Progress Notes (Signed)
HPI Linda Lane returns today for ongoing evaluation and management of her permanent PM. She is a pleasant 74 yo woman with a h/o HTN, dyslipidemia, and bradycardia who is status post permanent pacemaker insertion in 2010. Since then, she has had no recurrent syncope. She denies palpitations, chest pain, or shortness of breath. She does admit to some sodium indiscretion. She did fall and break her back and has had continued pain for over a year.  No Known Allergies   Current Outpatient Prescriptions  Medication Sig Dispense Refill  . amLODipine (NORVASC) 5 MG tablet Take 1 tablet (5 mg total) by mouth daily. 90 tablet 1  . aspirin 81 MG tablet Take 81 mg by mouth daily.    Marland Kitchen atenolol (TENORMIN) 50 MG tablet Take 1 tablet (50 mg total) by mouth daily. 30 tablet 3  . atorvastatin (LIPITOR) 10 MG tablet Take 1 tablet (10 mg total) by mouth every evening. 90 tablet 1  . Cholecalciferol (VITAMIN D-3) 1000 UNITS CAPS Take 1,000 Units by mouth daily.    . lansoprazole (PREVACID) 30 MG capsule Take 1 capsule (30 mg total) by mouth 2 (two) times daily. 180 capsule 1  . lisinopril (PRINIVIL,ZESTRIL) 10 MG tablet Take 1 tablet (10 mg total) by mouth daily. 90 tablet 1  . oxyCODONE-acetaminophen (PERCOCET) 7.5-325 MG per tablet Take 1 tablet by mouth every 6 (six) hours as needed. (Patient taking differently: Take 1 tablet by mouth every 6 (six) hours as needed (pain). ) 120 tablet 0  . rOPINIRole (REQUIP) 1 MG tablet Take 1 tablet (1 mg total) by mouth at bedtime. 90 tablet 1   No current facility-administered medications for this visit.     Past Medical History  Diagnosis Date  . HTN (hypertension)   . 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 History of gastroesophageal reflux (GERD)   . Peptic ulcer disease   . Hyperlipidemia   . History of colon polyps   . CAD (coronary artery disease)   . SOB (shortness of breath)   . Cancer 2005    history of melanoma face  . Complication  of anesthesia 2003 and 2004    woke up during colonscopy  . Ankle edema for last 1 week    both ankles  . Osteoporosis   . Osteoporosis, unspecified 09/19/2012    ROS:   All systems reviewed and negative except as noted in the HPI.   Past Surgical History  Procedure Laterality Date  . Middle ear surgery Bilateral   . Abdominal hysterectomy    . Cholecystectomy    . Appendectomy    . Bilateral breast implants    . Laparoscopic gastric banding    . Gastric bypass    . Tonsillectomy  1962  . Pacemaker placement  1999, 2007, 2010  . Melanoma excision  2005    beneath chin  . Esophagogastroduodenoscopy N/A 09/14/2012    Procedure: ESOPHAGOGASTRODUODENOSCOPY (EGD);  Surgeon: Milus Banister, MD;  Location: [facility];  Service: Endoscopy;  Laterality: N/A;  . Balloon dilation N/A 09/14/2012    Procedure: BALLOON DILATION;  Surgeon: Milus Banister, MD;  Location: [facility];  Service: Endoscopy;  Laterality: N/A;  . Varicose vein surgery  2012     Family History  Problem Relation Age of Onset  . Heart attack Mother   . Stroke Mother   . Ulcers Father   . Stroke Father   . Hypertension Brother   . Hypertension Sister   .  Heart disease Maternal Grandmother   . Heart disease Maternal Grandfather   . Colon cancer Neg Hx      History   Social History  . Marital Status: Divorced    Spouse Name: N/A  . Number of Children: 2  . Years of Education: N/A   Occupational History  . Not on file.   Social History Main Topics  . Smoking status: Current Every Day Smoker -- 0.50 packs/day    Types: Cigarettes  . Smokeless tobacco: Never Used     Comment: 8-10 cigarettes a day  . Alcohol Use: No     Comment: no alcohol since jan 2014  . Drug Use: No  . Sexual Activity: Not on file   Other Topics Concern  . Not on file   Social History Narrative     BP 108/72 mmHg  Pulse 89  Ht 5' 3.5" (1.613 m)  Wt 145 lb 12.8 oz (66.134 kg)  BMI 25.42 kg/m2  LMP  04/05/1988  Physical Exam:  Well appearing 74 year old woman, NAD HEENT: Unremarkable Neck:  6 cm JVD, no thyromegally Lungs:  Clear with no wheezes, rales, or rhonchi. HEART:  Regular rate rhythm, no murmurs, no rubs, no clicks Abd:  soft, positive bowel sounds, no organomegally, no rebound, no guarding Ext:  2 plus pulses, no edema, no cyanosis, no clubbing Skin:  No rashes no nodules Neuro:  CN II through XII intact, motor grossly intact  DEVICE  Normal device function.  See PaceArt for details.   Assess/Plan:

## 2014-09-03 NOTE — Patient Instructions (Signed)
Medication Instructions:  Your physician recommends that you continue on your current medications as directed. Please refer to the Current Medication list given to you today.   Labwork: None ordered  Testing/Procedures: None ordered  Follow-Up: Your physician wants you to follow-up in: 12 months with Dr Knox Saliva will receive a reminder letter in the mail two months in advance. If you don't receive a letter, please call our office to schedule the follow-up appointment.  Remote monitoring is used to monitor your Pacemaker or ICD from home. This monitoring reduces the number of office visits required to check your device to one time per year. It allows [place] to keep an eye on the functioning of your device to ensure it is working properly. You are scheduled for a device check from home on 12/03/14. You may send your transmission at any time that day. If you have a wireless device, the transmission will be sent automatically. After your physician reviews your transmission, you will receive a postcard with your next transmission date.     Any Other Special Instructions Will Be Listed Below (If Applicable).

## 2014-09-09 ENCOUNTER — Telehealth: Payer: Self-pay | Admitting: Family

## 2014-09-09 MED ORDER — OXYCODONE-ACETAMINOPHEN 7.5-325 MG PO TABS: 1.0000 | ORAL_TABLET | Freq: Four times a day (QID) | ORAL | Status: DC | PRN

## 2014-09-09 NOTE — Telephone Encounter (Signed)
Please advise her that I will give her 60 tabs only. Further refills should come from pain management.  She needs to keep her upcoming apt on 6/20.

## 2014-09-09 NOTE — Telephone Encounter (Signed)
Rx placed at front desk for pick up. Left detailed message on pt's home # and to try her best to keep upcoming appt with pain management as it is very difficult to get these appts.

## 2014-09-09 NOTE — Telephone Encounter (Signed)
Caller name: Shanaia Relation to pt: self Call back number: [phone] Pharmacy:  Reason for call:   Patient had to reschedule pain mgmt appointment and is now out of oxycodone and is requesting a two week supply. She has an appointment on 09/23/14 with pain mgmt.

## 2014-10-08 ENCOUNTER — Encounter: Payer: Self-pay | Admitting: Family

## 2014-10-08 ENCOUNTER — Ambulatory Visit (INDEPENDENT_AMBULATORY_CARE_PROVIDER_SITE_OTHER): Payer: Commercial Managed Care - HMO | Admitting: Family

## 2014-10-08 VITALS — BP 122/78 | HR 64 | Temp 98.1°F | Resp 16 | Ht 63.25 in | Wt 145.4 lb

## 2014-10-08 DIAGNOSIS — G8929 Other chronic pain: Secondary | ICD-10-CM | POA: Diagnosis not present

## 2014-10-08 DIAGNOSIS — M5489 Other dorsalgia: Secondary | ICD-10-CM

## 2014-10-08 DIAGNOSIS — G2581 Restless legs syndrome: Secondary | ICD-10-CM | POA: Diagnosis not present

## 2014-10-08 DIAGNOSIS — I10 Essential (primary) hypertension: Secondary | ICD-10-CM | POA: Diagnosis not present

## 2014-10-08 DIAGNOSIS — M546 Pain in thoracic spine: Principal | ICD-10-CM

## 2014-10-08 LAB — COMPREHENSIVE METABOLIC PANEL
ALT: 15 U/L (ref 0–35)
AST: 19 U/L (ref 0–37)
Albumin: 4 g/dL (ref 3.5–5.2)
Alkaline Phosphatase: 51 U/L (ref 39–117)
BUN: 19 mg/dL (ref 6–23)
CO2: 26 mEq/L (ref 19–32)
Calcium: 8.5 mg/dL (ref 8.4–10.5)
Chloride: 108 mEq/L (ref 96–112)
Creat: 0.62 mg/dL (ref 0.50–1.10)
Glucose, Bld: 105 mg/dL — ABNORMAL HIGH (ref 70–99)
Potassium: 4.1 mEq/L (ref 3.5–5.3)
Sodium: 142 mEq/L (ref 135–145)
Total Bilirubin: 0.4 mg/dL (ref 0.2–1.2)
Total Protein: 6.1 g/dL (ref 6.0–8.3)

## 2014-10-08 LAB — LIPID PANEL
Cholesterol: 143 mg/dL (ref 0–200)
HDL: 45 mg/dL — ABNORMAL LOW (ref >=46)
LDL Cholesterol: 80 mg/dL (ref 0–99)
Total CHOL/HDL Ratio: 3.2 Ratio
Triglycerides: 90 mg/dL (ref <150)
VLDL: 18 mg/dL (ref 0–40)

## 2014-10-08 MED ORDER — ROPINIROLE HCL 1 MG PO TABS: 1.0000 mg | ORAL_TABLET | Freq: Every day | ORAL | Status: DC

## 2014-10-08 MED ORDER — ATORVASTATIN CALCIUM 10 MG PO TABS: 10.0000 mg | ORAL_TABLET | Freq: Every evening | ORAL | Status: DC

## 2014-10-08 MED ORDER — LANSOPRAZOLE 30 MG PO CPDR: 30.0000 mg | DELAYED_RELEASE_CAPSULE | Freq: Two times a day (BID) | ORAL | Status: DC

## 2014-10-08 MED ORDER — LISINOPRIL 10 MG PO TABS: 10.0000 mg | ORAL_TABLET | Freq: Every day | ORAL | Status: DC

## 2014-10-08 MED ORDER — ATENOLOL 50 MG PO TABS: 50.0000 mg | ORAL_TABLET | Freq: Every day | ORAL | Status: DC

## 2014-10-08 NOTE — Assessment & Plan Note (Signed)
Stable on current meds. She is scheduled to have CMET drawn today.

## 2014-10-08 NOTE — Patient Instructions (Signed)
Please complete lab work prior to leaving. Follow up in 3 month.

## 2014-10-08 NOTE — Progress Notes (Signed)
Pre visit review using our clinic review tool, if applicable. No additional management support is needed unless otherwise documented below in the visit note. 

## 2014-10-08 NOTE — Addendum Note (Signed)
Addended by: Peggyann Shoals on: 10/08/2014 10:20 AM   Modules accepted: Orders

## 2014-10-08 NOTE — Progress Notes (Signed)
Subjective:    Patient ID: Linda Lane, female    DOB: 1941/03/01, 74 y.o.   MRN: [id number]  HPI  Linda Lane is a 74 yr old female who presents today for follow up.  1) HTN- Patient is currently maintained on the following medications for blood pressure: amlodipine, atenolol, lisinopril.  Patient reports good compliance with blood pressure medications. Patient denies chest pain, shortness of breath or swelling. Last 3 blood pressure readings in our office are as follows: BP Readings from Last 3 Encounters:  10/08/14 122/78  09/03/14 108/72  08/13/14 124/78   2) RLS- reports that she continues Requip. Some improvement with requip.    3) Chronic pain- She is currently being managed by the [facility] clinic. She tells me that she is hoping to wean off of percocet.     Review of Systems See HPI  Past Medical History  Diagnosis Date  . HTN (hypertension)   . 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 History of gastroesophageal reflux (GERD)   . Peptic ulcer disease   . Hyperlipidemia   . History of colon polyps   . CAD (coronary artery disease)   . SOB (shortness of breath)   . Cancer 2005    history of melanoma face  . Complication of anesthesia 2003 and 2004    woke up during colonscopy  . Ankle edema for last 1 week    both ankles  . Osteoporosis   . Osteoporosis, unspecified 09/19/2012    History   Social History  . Marital Status: Divorced    Spouse Name: N/A  . Number of Children: 2  . Years of Education: N/A   Occupational History  . Not on file.   Social History Main Topics  . Smoking status: Current Every Day Smoker -- 0.50 packs/day    Types: Cigarettes  . Smokeless tobacco: Never Used     Comment: 6 cigarettes a day  . Alcohol Use: No     Comment: no alcohol since jan 2014  . Drug Use: No  . Sexual Activity: Not on file   Other Topics Concern  . Not on file   Social History Narrative    Past Surgical History  Procedure  Laterality Date  . Middle ear surgery Bilateral   . Abdominal hysterectomy    . Cholecystectomy    . Appendectomy    . Bilateral breast implants    . Laparoscopic gastric banding    . Gastric bypass    . Tonsillectomy  1962  . Pacemaker placement  1999, 2007, 2010  . Melanoma excision  2005    beneath chin  . Esophagogastroduodenoscopy N/A 09/14/2012    Procedure: ESOPHAGOGASTRODUODENOSCOPY (EGD);  Surgeon: Milus Banister, MD;  Location: [facility];  Service: Endoscopy;  Laterality: N/A;  . Balloon dilation N/A 09/14/2012    Procedure: BALLOON DILATION;  Surgeon: Milus Banister, MD;  Location: [facility];  Service: Endoscopy;  Laterality: N/A;  . Varicose vein surgery  2012    Family History  Problem Relation Age of Onset  . Heart attack Mother   . Stroke Mother   . Ulcers Father   . Stroke Father   . Hypertension Brother   . Hypertension Sister   . Heart disease Maternal Grandmother   . Heart disease Maternal Grandfather   . Colon cancer Neg Hx     No Known Allergies  Current Outpatient Prescriptions on File Prior to Visit  Medication Sig Dispense  Refill  . amLODipine (NORVASC) 5 MG tablet Take 1 tablet (5 mg total) by mouth daily. 90 tablet 1  . aspirin 81 MG tablet Take 81 mg by mouth daily.    . Cholecalciferol (VITAMIN D-3) 1000 UNITS CAPS Take 1,000 Units by mouth daily.    Marland Kitchen oxyCODONE-acetaminophen (PERCOCET) 7.5-325 MG per tablet Take 1 tablet by mouth every 6 (six) hours as needed. 60 tablet 0   No current facility-administered medications on file prior to visit.    BP 122/78 mmHg  Pulse 64  Temp(Src) 98.1 F (36.7 C) (Oral)  Resp 16  Ht 5' 3.25" (1.607 m)  Wt 145 lb 6.4 oz (65.953 kg)  BMI 25.54 kg/m2  SpO2 97%  LMP 04/05/1988       Objective:   Physical Exam  Constitutional: She is oriented to person, place, and time. She appears well-developed and well-nourished.  HENT:  Head: Normocephalic and atraumatic.  Cardiovascular: Normal rate,  regular rhythm and normal heart sounds.   No murmur heard. Pulmonary/Chest: Effort normal and breath sounds normal. No respiratory distress. She has no wheezes.  Musculoskeletal: She exhibits no edema.  Neurological: She is alert and oriented to person, place, and time.  Psychiatric: She has a normal mood and affect. Her behavior is normal. Judgment and thought content normal.          Assessment & Plan:

## 2014-10-08 NOTE — Assessment & Plan Note (Signed)
Stable on requip, continue same.

## 2014-10-08 NOTE — Assessment & Plan Note (Signed)
Stable management per pain clinic.

## 2014-10-09 ENCOUNTER — Encounter: Payer: Self-pay | Admitting: *Deleted

## 2014-11-01 ENCOUNTER — Telehealth: Payer: Self-pay | Admitting: Family

## 2014-11-01 MED ORDER — OXYCODONE-ACETAMINOPHEN 7.5-325 MG PO TABS: 1.0000 | ORAL_TABLET | Freq: Four times a day (QID) | ORAL | Status: DC | PRN

## 2014-11-01 NOTE — Telephone Encounter (Signed)
Reviewed controlled substance registry. Pt had percocet filled on 10/08/14 #120.  See rx, can be refilled on 8/4.

## 2014-11-01 NOTE — Telephone Encounter (Signed)
Notified pt that we are trying to find another participating pain management group and will provide Rx until established. She is aware that Rx will be ready for pick up on 11/07/14.

## 2014-11-01 NOTE — Telephone Encounter (Signed)
Relation to HF:GBMS  Call back number: [phone]   Reason for call:  Patient pain management no longer accepts her insurance and would like Melissa to manage her pain medication.

## 2014-11-01 NOTE — Telephone Encounter (Signed)
Could you please contact her pain clinic and verify this is accurate?  She has been following at the [facility] clinic.

## 2014-11-01 NOTE — Telephone Encounter (Signed)
Delsa Sale-- Can you please check with pt's insurance and find out who is a participating pain clinic for her? Spoke with [facility] and they confirmed they are no longer accepting [employer].

## 2014-11-07 ENCOUNTER — Other Ambulatory Visit: Payer: Self-pay | Admitting: Behavioral Health

## 2014-11-07 NOTE — Telephone Encounter (Signed)
Error

## 2014-11-07 NOTE — Telephone Encounter (Signed)
Per note below, Rx given to the patient in office today.

## 2014-11-12 NOTE — Telephone Encounter (Signed)
[facility] to [facility]/awaiting appt

## 2014-12-03 ENCOUNTER — Encounter: Payer: Self-pay | Admitting: Internal Medicine

## 2014-12-03 ENCOUNTER — Ambulatory Visit (INDEPENDENT_AMBULATORY_CARE_PROVIDER_SITE_OTHER): Payer: Commercial Managed Care - HMO | Admitting: *Deleted

## 2014-12-03 DIAGNOSIS — R001 Bradycardia, unspecified: Secondary | ICD-10-CM

## 2014-12-03 NOTE — Progress Notes (Signed)
Remote pacemaker transmission.   

## 2014-12-05 ENCOUNTER — Other Ambulatory Visit: Payer: Self-pay | Admitting: Family

## 2014-12-05 NOTE — Telephone Encounter (Signed)
Caller name:Britiny Relationship to patient:self Can be reached:[phone] Pharmacy:  Reason for call:oxycodone refill

## 2014-12-05 NOTE — Telephone Encounter (Signed)
Last filled: 11/01/14 Amt: 120, 0 Last OV: 10/08/14 Next appt: 01/08/15 Contract on file UDS: MODERATE risk  Please advise.

## 2014-12-05 NOTE — Telephone Encounter (Signed)
Please let pt know that I want her to start weaning down on her pain medicine. I have sent rx fo 5/325mg  which is a slighly lower dose. She should only take 1 tab avery 6 hours and no more than 4 tabs in 24 hours.  Next month we will plan to further reduce her overall dose.

## 2014-12-06 LAB — CUP PACEART REMOTE DEVICE CHECK
Battery Impedance: 3089 Ohm
Battery Remaining Longevity: 17 mo
Battery Voltage: 2.73 V
Brady Statistic AP VP Percent: 4 %
Brady Statistic AS VP Percent: 1 %
Date Time Interrogation Session: 20160830143226
Lead Channel Impedance Value: 754 Ohm
Lead Channel Pacing Threshold Amplitude: 0.75 V
Lead Channel Pacing Threshold Amplitude: 1.25 V
Lead Channel Setting Pacing Amplitude: 2.5 V
Lead Channel Setting Pacing Amplitude: 2.5 V
Lead Channel Setting Pacing Pulse Width: 0.4 ms
Lead Channel Setting Sensing Sensitivity: 1 mV
MDC IDC MSMT LEADCHNL RA PACING THRESHOLD PULSEWIDTH: 0.4 ms
MDC IDC MSMT LEADCHNL RV IMPEDANCE VALUE: 517 Ohm
MDC IDC MSMT LEADCHNL RV PACING THRESHOLD PULSEWIDTH: 0.4 ms
MDC IDC STAT BRADY AP VS PERCENT: 86 %
MDC IDC STAT BRADY AS VS PERCENT: 9 %

## 2014-12-06 MED ORDER — OXYCODONE-ACETAMINOPHEN 5-325 MG PO TABS: 1.0000 | ORAL_TABLET | Freq: Four times a day (QID) | ORAL | Status: DC | PRN

## 2014-12-06 NOTE — Telephone Encounter (Signed)
Pt notified by front desk staff when she picked up rx.

## 2014-12-18 ENCOUNTER — Encounter: Payer: Self-pay | Admitting: Cardiology

## 2014-12-20 ENCOUNTER — Other Ambulatory Visit: Payer: Self-pay | Admitting: Family

## 2015-01-08 ENCOUNTER — Ambulatory Visit (INDEPENDENT_AMBULATORY_CARE_PROVIDER_SITE_OTHER): Payer: Commercial Managed Care - HMO | Admitting: Family

## 2015-01-08 ENCOUNTER — Encounter: Payer: Self-pay | Admitting: Family

## 2015-01-08 VITALS — BP 144/88 | HR 84 | Temp 98.4°F | Resp 18 | Ht 63.25 in | Wt 144.8 lb

## 2015-01-08 DIAGNOSIS — Z85828 Personal history of other malignant neoplasm of skin: Secondary | ICD-10-CM | POA: Diagnosis not present

## 2015-01-08 DIAGNOSIS — M546 Pain in thoracic spine: Secondary | ICD-10-CM

## 2015-01-08 DIAGNOSIS — M5489 Other dorsalgia: Secondary | ICD-10-CM | POA: Diagnosis not present

## 2015-01-08 DIAGNOSIS — B353 Tinea pedis: Secondary | ICD-10-CM

## 2015-01-08 DIAGNOSIS — M549 Dorsalgia, unspecified: Secondary | ICD-10-CM

## 2015-01-08 DIAGNOSIS — Z23 Encounter for immunization: Secondary | ICD-10-CM

## 2015-01-08 DIAGNOSIS — I10 Essential (primary) hypertension: Secondary | ICD-10-CM

## 2015-01-08 DIAGNOSIS — G8929 Other chronic pain: Secondary | ICD-10-CM

## 2015-01-08 MED ORDER — CLOTRIMAZOLE-BETAMETHASONE 1-0.05 % EX CREA: 1.0000 "application " | TOPICAL_CREAM | Freq: Two times a day (BID) | CUTANEOUS | Status: AC

## 2015-01-08 MED ORDER — OXYCODONE-ACETAMINOPHEN 5-325 MG PO TABS
1.0000 | ORAL_TABLET | Freq: Four times a day (QID) | ORAL | Status: DC | PRN
Start: 2015-01-08 — End: 2015-02-07

## 2015-01-08 NOTE — Patient Instructions (Signed)
Please go to the lab to complete urine drug screen. Follow up in 3 months.

## 2015-01-08 NOTE — Progress Notes (Signed)
Pre visit review using our clinic review tool, if applicable. No additional management support is needed unless otherwise documented below in the visit note. 

## 2015-01-08 NOTE — Assessment & Plan Note (Signed)
Stable on decreased dose of percocet. Continue current dose.

## 2015-01-08 NOTE — Assessment & Plan Note (Signed)
Fair BP control on current meds. Continue same.

## 2015-01-08 NOTE — Progress Notes (Signed)
Subjective:    Patient ID: Linda Lane, female    DOB: [birth year], 74 y.o.   MRN: [id number]  HPI  Linda Lane is a 74 yr old female who presents today for follow up.  1) Chronic back pain- She reports that pain is well controlled on the 5/325 dose of percocet which was decreased from 7.5/325 last visit.  She notes that if the medication wears off she has recurrent back pain which can limit her activity and ability to perform ADL's.   2) Skin rash- reports pruritic rash on sole of the right foot. Tried otc athlete's foot without improvement.     3) HTN- Pt is maintained on amlodipine,  Atenolol, lisinopril.  BP Readings from Last 3 Encounters:  01/08/15 144/88  10/08/14 122/78  09/03/14 108/72      Review of Systems See HPI  Past Medical History  Diagnosis Date  . HTN (hypertension)   . 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 History of gastroesophageal reflux (GERD)   . Peptic ulcer disease   . Hyperlipidemia   . History of colon polyps   . CAD (coronary artery disease)   . SOB (shortness of breath)   . Cancer [facility]) 2005    history of melanoma face  . Complication of anesthesia 2003 and 2004    woke up during colonscopy  . Ankle edema for last 1 week    both ankles  . Osteoporosis   . Osteoporosis, unspecified 09/19/2012    Social History   Social History  . Marital Status: Divorced    Spouse Name: N/A  . Number of Children: 2  . Years of Education: N/A   Occupational History  . Not on file.   Social History Main Topics  . Smoking status: Current Every Day Smoker -- 0.50 packs/day    Types: Cigarettes  . Smokeless tobacco: Never Used     Comment: 6 cigarettes a day  . Alcohol Use: No     Comment: no alcohol since jan 2014  . Drug Use: No  . Sexual Activity: Not on file   Other Topics Concern  . Not on file   Social History Narrative    Past Surgical History  Procedure Laterality Date  . Middle ear surgery Bilateral   .  Abdominal hysterectomy    . Cholecystectomy    . Appendectomy    . Bilateral breast implants    . Laparoscopic gastric banding    . Gastric bypass    . Tonsillectomy  1962  . Pacemaker placement  1999, 2007, 2010  . Melanoma excision  2005    beneath chin  . Esophagogastroduodenoscopy N/A 09/14/2012    Procedure: ESOPHAGOGASTRODUODENOSCOPY (EGD);  Surgeon: Milus Banister, MD;  Location: [facility];  Service: Endoscopy;  Laterality: N/A;  . Balloon dilation N/A 09/14/2012    Procedure: BALLOON DILATION;  Surgeon: Milus Banister, MD;  Location: [facility];  Service: Endoscopy;  Laterality: N/A;  . Varicose vein surgery  2012    Family History  Problem Relation Age of Onset  . Heart attack Mother   . Stroke Mother   . Ulcers Father   . Stroke Father   . Hypertension Brother   . Hypertension Sister   . Heart disease Maternal Grandmother   . Heart disease Maternal Grandfather   . Colon cancer Neg Hx     No Known Allergies  Current Outpatient Prescriptions on File Prior to Visit  Medication Sig  Dispense Refill  . amLODipine (NORVASC) 5 MG tablet TAKE 1 TABLET (5 MG TOTAL) BY MOUTH DAILY. 90 tablet 1  . aspirin 81 MG tablet Take 81 mg by mouth daily.    Marland Kitchen atenolol (TENORMIN) 50 MG tablet Take 1 tablet (50 mg total) by mouth daily. 90 tablet 1  . atorvastatin (LIPITOR) 10 MG tablet Take 1 tablet (10 mg total) by mouth every evening. 90 tablet 1  . Cholecalciferol (VITAMIN D-3) 1000 UNITS CAPS Take 1,000 Units by mouth daily.    Marland Kitchen EVZIO 0.4 MG/0.4ML SOAJ Take 0.4 mg by mouth as needed.    . lansoprazole (PREVACID) 30 MG capsule Take 1 capsule (30 mg total) by mouth 2 (two) times daily. 180 capsule 1  . lisinopril (PRINIVIL,ZESTRIL) 10 MG tablet Take 1 tablet (10 mg total) by mouth daily. 90 tablet 1  . rOPINIRole (REQUIP) 1 MG tablet Take 1 tablet (1 mg total) by mouth at bedtime. 90 tablet 1   No current facility-administered medications on file prior to visit.    BP 144/88  mmHg  Pulse 84  Temp(Src) 98.4 F (36.9 C) (Oral)  Resp 18  Ht 5' 3.25" (1.607 m)  Wt 144 lb 12.8 oz (65.681 kg)  BMI 25.43 kg/m2  SpO2 99%  LMP 04/05/1988       Objective:   Physical Exam  Constitutional: She is oriented to person, place, and time. She appears well-developed and well-nourished.  HENT:  Head: Normocephalic and atraumatic.  Cardiovascular: Normal rate, regular rhythm and normal heart sounds.   No murmur heard. Pulmonary/Chest: Effort normal and breath sounds normal. No respiratory distress. She has no wheezes.  Musculoskeletal: She exhibits no edema.  Neurological: She is alert and oriented to person, place, and time.  Psychiatric: She has a normal mood and affect. Her behavior is normal. Judgment and thought content normal.  skin: rash noted right sole of foot- dry peeling skin       Assessment & Plan:  Tinea pedis- trial of lotrisone cream.

## 2015-02-03 ENCOUNTER — Encounter: Payer: Self-pay | Admitting: Family

## 2015-02-06 ENCOUNTER — Telehealth: Payer: Self-pay | Admitting: Family

## 2015-02-06 NOTE — Telephone Encounter (Signed)
Relation to FB:PPHK Call back [place]:  Reason for call:  Patient requesting a refill oxyCODONE-acetaminophen (ROXICET) 5-325 MG tablet and would like to know the status of osteoporosis shot Rx. Patient is aware NP is off today advise office will follow up tomorrow 02/07/15

## 2015-02-07 MED ORDER — OXYCODONE-ACETAMINOPHEN 5-325 MG PO TABS: 1.0000 | ORAL_TABLET | Freq: Four times a day (QID) | ORAL | Status: DC | PRN

## 2015-02-07 NOTE — Telephone Encounter (Signed)
Rx signed and placed at front desk for pick up. Notified pt. Advised her I will order Prolia injection and call her to schedule nurse visit once we received injection. Pt voices understanding.

## 2015-02-07 NOTE — Telephone Encounter (Signed)
Last Rx 01/08/15, next UDS 04/2015. Rx printed and forwarded to PCP for signature.  Pt is due for 2nd Prolia injection and authorization still good through this year.

## 2015-02-10 NOTE — Telephone Encounter (Signed)
Prolia received. Notified pt and scheduled nurse visit for 02/12/15 at 2:30pm.

## 2015-02-12 ENCOUNTER — Ambulatory Visit (INDEPENDENT_AMBULATORY_CARE_PROVIDER_SITE_OTHER): Payer: Commercial Managed Care - HMO

## 2015-02-12 DIAGNOSIS — M810 Age-related osteoporosis without current pathological fracture: Secondary | ICD-10-CM | POA: Diagnosis not present

## 2015-02-12 MED ORDER — DENOSUMAB 60 MG/ML ~~LOC~~ SOLN
60.0000 mg | Freq: Once | SUBCUTANEOUS | Status: AC
  Administered 2015-02-12: 60 mg via SUBCUTANEOUS

## 2015-02-12 NOTE — Progress Notes (Signed)
Pre visit review using our clinic review tool, if applicable. No additional management support is needed unless otherwise documented below in the visit note.  Patient in for Prolia Injection. Given Right deltoid.

## 2015-03-04 ENCOUNTER — Telehealth: Payer: Self-pay | Admitting: Family

## 2015-03-04 ENCOUNTER — Ambulatory Visit (INDEPENDENT_AMBULATORY_CARE_PROVIDER_SITE_OTHER): Payer: Commercial Managed Care - HMO | Admitting: *Deleted

## 2015-03-04 DIAGNOSIS — R001 Bradycardia, unspecified: Secondary | ICD-10-CM

## 2015-03-04 MED ORDER — OXYCODONE-ACETAMINOPHEN 5-325 MG PO TABS: 1.0000 | ORAL_TABLET | Freq: Four times a day (QID) | ORAL | Status: DC | PRN

## 2015-03-04 NOTE — Telephone Encounter (Signed)
Last Oxycodone Rx 02/07/15, #120. Last UDS:  01/08/15 Next UDS due:  04/10/15 F/u: 04/11/15  Rx printed with note to fill on or after 03/07/15. PCP out of office today. Rx printed and forwarded to PCP to sign tomorrow. Sent mychart response to pt.

## 2015-03-06 ENCOUNTER — Encounter: Payer: Self-pay | Admitting: Family

## 2015-03-06 NOTE — Progress Notes (Signed)
Remote pacemaker transmission.   

## 2015-03-07 MED ORDER — ATORVASTATIN CALCIUM 10 MG PO TABS: 10.0000 mg | ORAL_TABLET | Freq: Every evening | ORAL | Status: DC

## 2015-03-07 MED ORDER — ATENOLOL 50 MG PO TABS: 50.0000 mg | ORAL_TABLET | Freq: Every day | ORAL | Status: DC

## 2015-03-07 MED ORDER — OXYCODONE-ACETAMINOPHEN 5-325 MG PO TABS: 1.0000 | ORAL_TABLET | Freq: Four times a day (QID) | ORAL | Status: AC | PRN

## 2015-03-07 MED ORDER — ROPINIROLE HCL 1 MG PO TABS: 1.0000 mg | ORAL_TABLET | Freq: Every day | ORAL | Status: DC

## 2015-03-07 MED ORDER — LANSOPRAZOLE 30 MG PO CPDR: 30.0000 mg | DELAYED_RELEASE_CAPSULE | Freq: Two times a day (BID) | ORAL | Status: DC

## 2015-03-07 MED ORDER — AMLODIPINE BESYLATE 5 MG PO TABS: ORAL_TABLET | ORAL | Status: DC

## 2015-03-07 MED ORDER — LISINOPRIL 10 MG PO TABS: 10.0000 mg | ORAL_TABLET | Freq: Every day | ORAL | Status: DC

## 2015-03-13 LAB — CUP PACEART REMOTE DEVICE CHECK
Brady Statistic AS VS Percent: 10 %
Date Time Interrogation Session: 20161129162852
Implantable Lead Implant Date: 20100820
Implantable Lead Location: 753859
Implantable Lead Model: 5568
Lead Channel Impedance Value: 538 Ohm
Lead Channel Setting Pacing Amplitude: 2.5 V
MDC IDC LEAD IMPLANT DT: 20100820
MDC IDC LEAD LOCATION: 753860
MDC IDC MSMT BATTERY IMPEDANCE: 3388 Ohm
MDC IDC MSMT BATTERY REMAINING LONGEVITY: 14 mo
MDC IDC MSMT BATTERY VOLTAGE: 2.71 V
MDC IDC MSMT LEADCHNL RA IMPEDANCE VALUE: 749 Ohm
MDC IDC SET LEADCHNL RA PACING AMPLITUDE: 2.5 V
MDC IDC SET LEADCHNL RV PACING PULSEWIDTH: 0.4 ms
MDC IDC SET LEADCHNL RV SENSING SENSITIVITY: 1 mV
MDC IDC STAT BRADY AP VP PERCENT: 4 %
MDC IDC STAT BRADY AP VS PERCENT: 84 %
MDC IDC STAT BRADY AS VP PERCENT: 1 %

## 2015-03-14 ENCOUNTER — Encounter: Payer: Self-pay | Admitting: Cardiology

## 2015-03-28 ENCOUNTER — Encounter: Payer: Self-pay | Admitting: Cardiology

## 2015-04-08 ENCOUNTER — Telehealth: Payer: Self-pay | Admitting: Family

## 2015-04-08 NOTE — Telephone Encounter (Signed)
Pt picked up written prescriptions for all meds on 03/07/2015. Called pt at [phone] to verify, no answer, no voicemail set up. Will try to reach patient again later.

## 2015-04-08 NOTE — Telephone Encounter (Signed)
Caller name:Erica Relationship to patient:pharmacist Can be [phone] Pharmacy: [facility] [id number] [place] for call:Patient has moved to [place] and would like all her meds sent to the above pharmacy.  Fax [phone]

## 2015-04-11 ENCOUNTER — Ambulatory Visit: Payer: Commercial Managed Care - HMO | Admitting: Family

## 2015-04-11 MED ORDER — ATENOLOL 50 MG PO TABS: 50.0000 mg | ORAL_TABLET | Freq: Every day | ORAL | Status: AC

## 2015-04-11 MED ORDER — LANSOPRAZOLE 30 MG PO CPDR: 30.0000 mg | DELAYED_RELEASE_CAPSULE | Freq: Two times a day (BID) | ORAL | Status: AC

## 2015-04-11 MED ORDER — ATORVASTATIN CALCIUM 10 MG PO TABS: 10.0000 mg | ORAL_TABLET | Freq: Every evening | ORAL | Status: AC

## 2015-04-11 MED ORDER — LISINOPRIL 10 MG PO TABS: 10.0000 mg | ORAL_TABLET | Freq: Every day | ORAL | Status: AC

## 2015-04-11 MED ORDER — ROPINIROLE HCL 1 MG PO TABS: 1.0000 mg | ORAL_TABLET | Freq: Every day | ORAL | Status: AC

## 2015-04-11 MED ORDER — AMLODIPINE BESYLATE 5 MG PO TABS: ORAL_TABLET | ORAL | Status: AC

## 2015-04-11 NOTE — Telephone Encounter (Signed)
Spoke with pt. She states that her prescriptions got wet during her move and are unreadable. Advised pt that we will send new Rxs.

## 2015-06-03 ENCOUNTER — Telehealth: Payer: Self-pay | Admitting: Cardiology

## 2015-06-03 ENCOUNTER — Encounter: Payer: Commercial Managed Care - HMO | Admitting: *Deleted

## 2015-06-03 NOTE — Telephone Encounter (Signed)
Attempted to confirm remote transmission with pt. No answer and was unable to leave a message.   

## 2015-06-04 ENCOUNTER — Encounter: Payer: Self-pay | Admitting: Cardiology

## 2016-05-06 IMAGING — CR DG LUMBAR SPINE COMPLETE 4+V
5 series · 5 of 5 positions shown · non-contrast
Comparison: Lumbar spine series of June 28, 2013

CLINICAL DATA: Status post fall 4 days ago with persistent low back
and left buttock pain; no radiculopathy

EXAM:
LUMBAR SPINE - COMPLETE 4+ VIEW

[t l-spine a.p.]
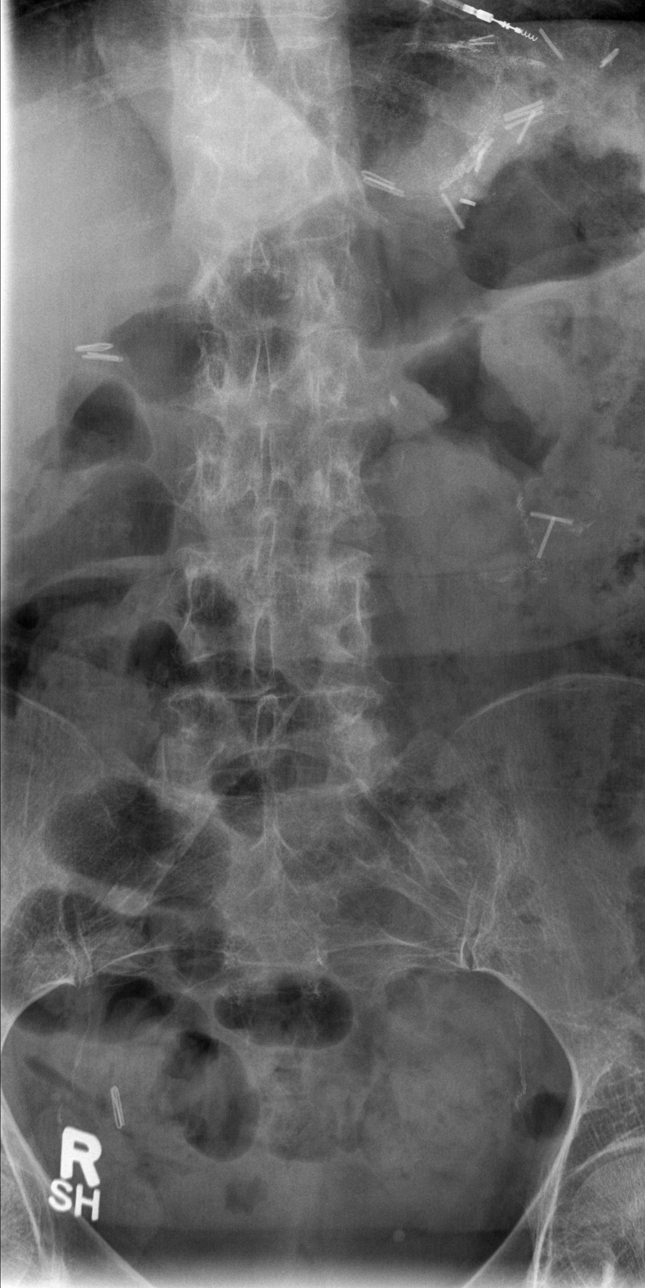

[t l-spine oblique exposure (1 of 2)]
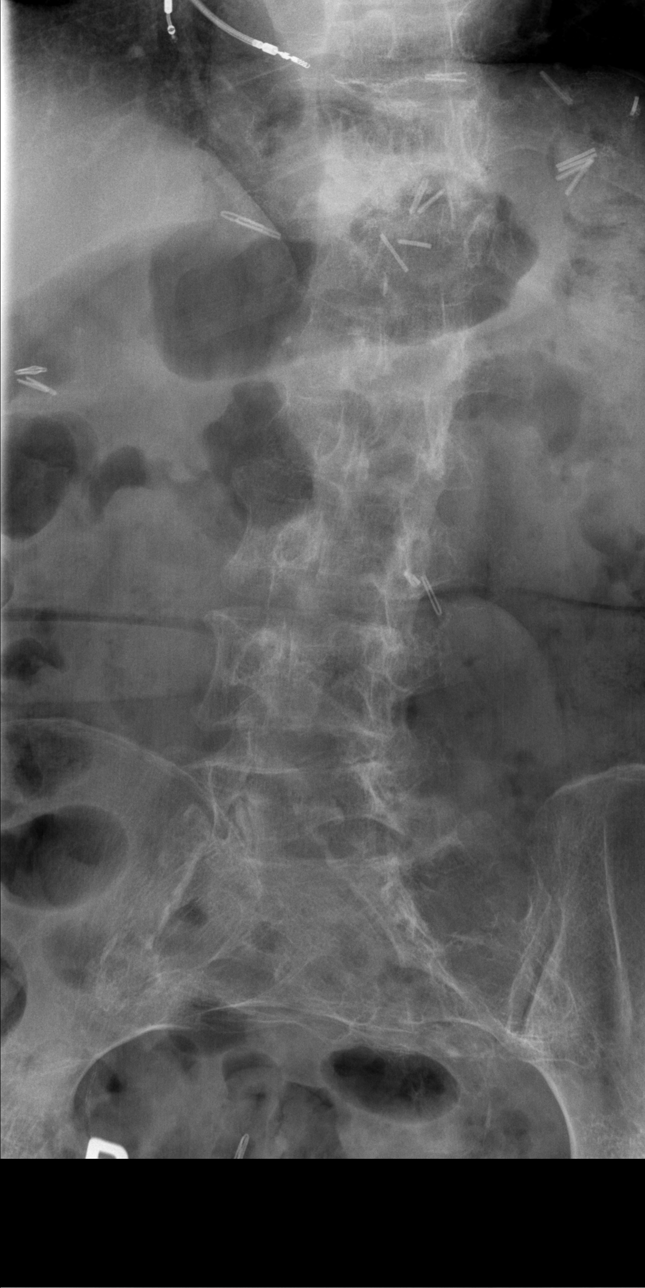

[t l-spine oblique exposure (2 of 2)]
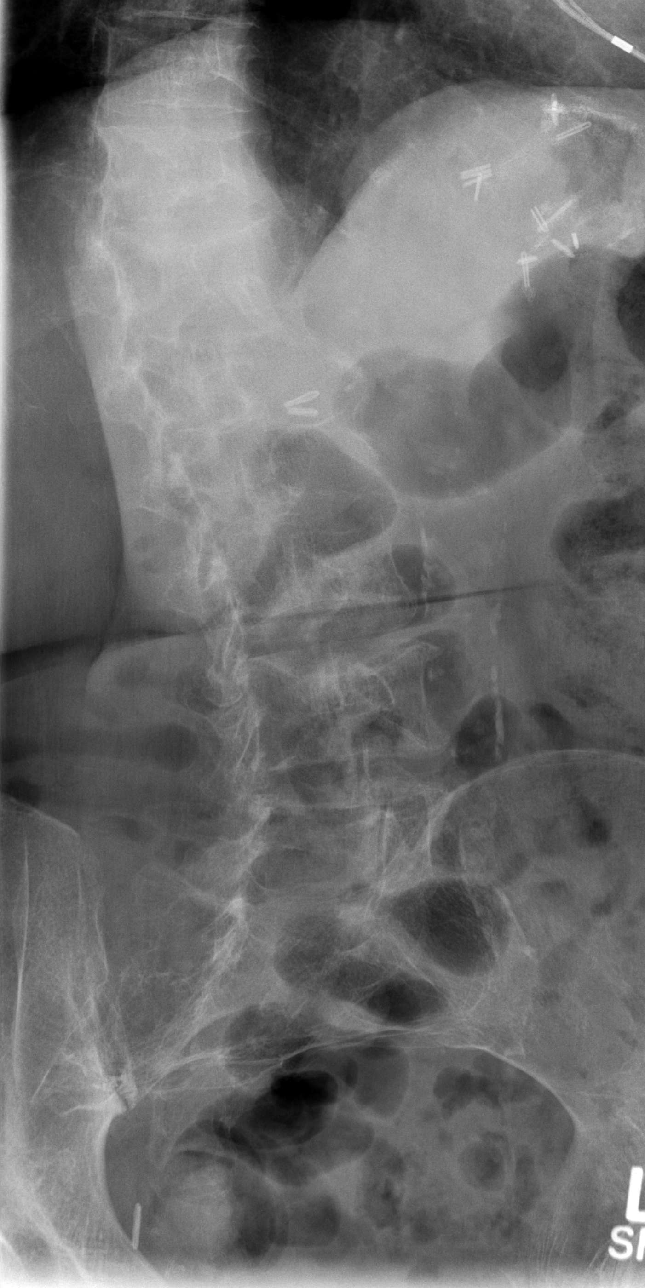

[t l-spine lat]
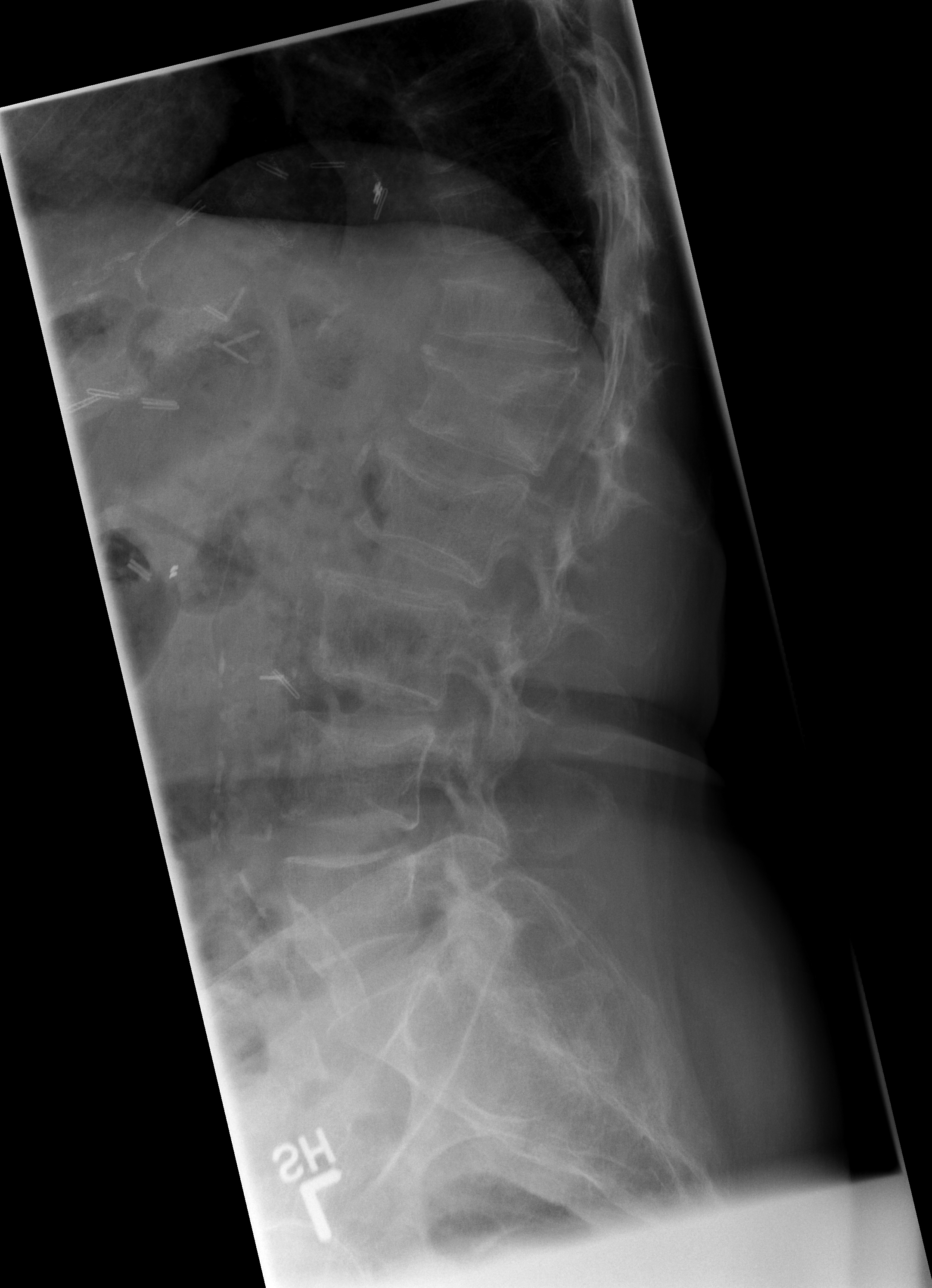

[t l-spine l5-s1 spot]
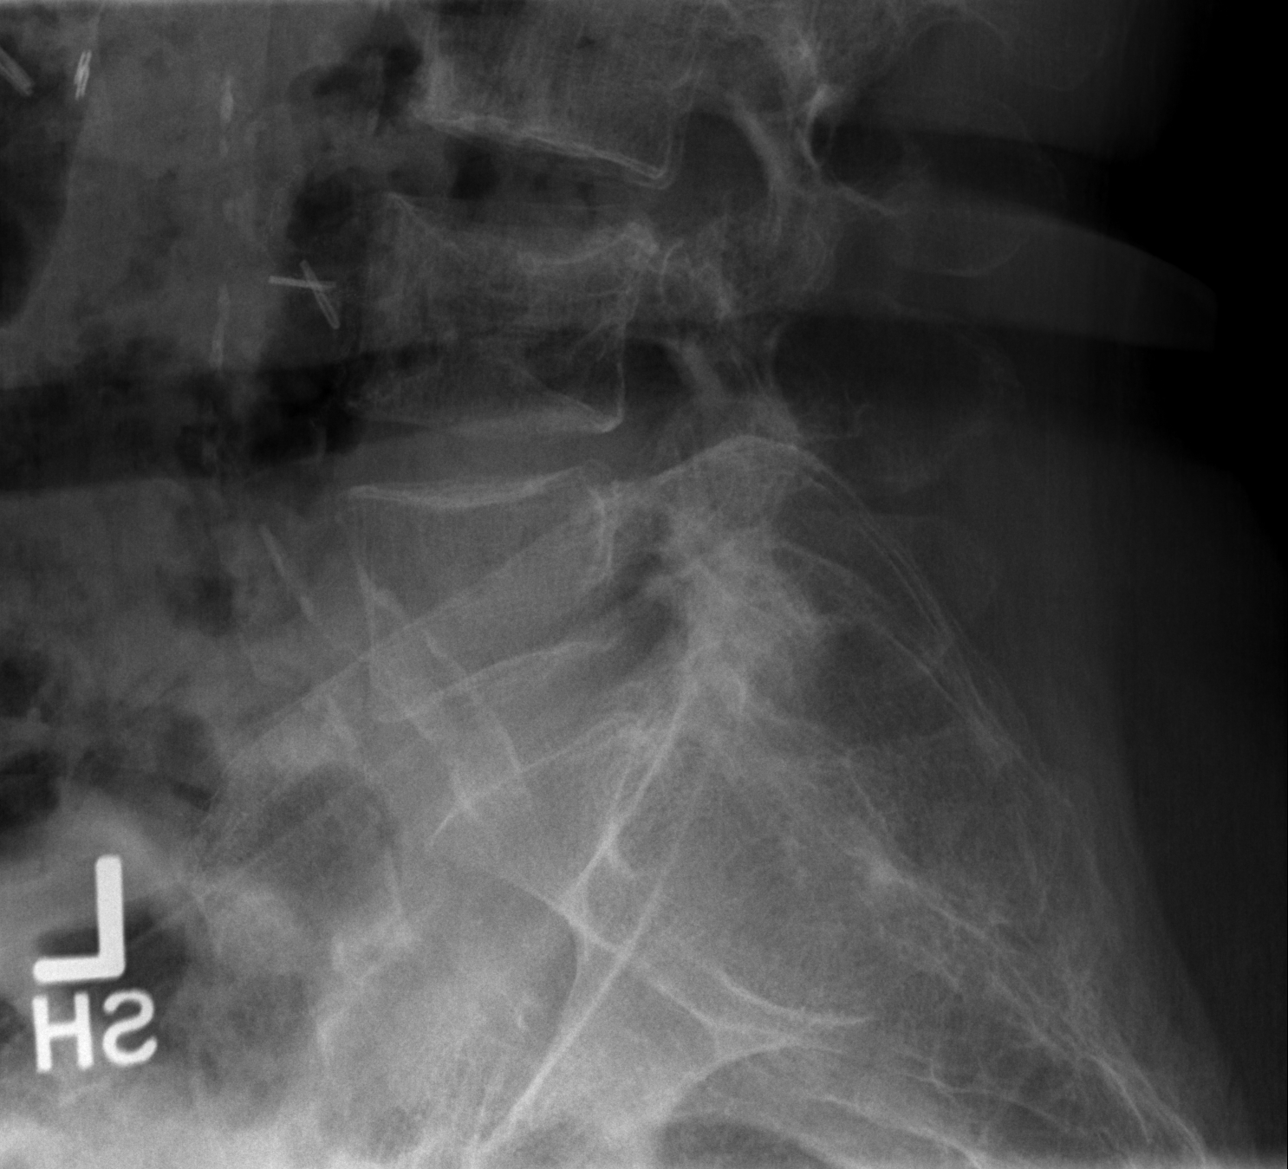

[5 of 5 positions shown; findings below may reference images not displayed]

FINDINGS: The bones are osteopenic. There is mild anterior wedge compression
of T12 which is stable. There is mild anterior wedge compression and
superior endplate depression of L1 and L2 which is also stable. L3
and L4 vertebral bodies are preserved in height though L4 exhibits
stable superior and inferior depression. L5 is intact. There is
stable mild facet joint hypertrophy at L4-5 and L5-S1. There is no
spondylolisthesis. The observed portions of the sacrum are
unremarkable.
IMPRESSION: There are chronic partial compressions of T12, L1, L2, and L4. There
is no acute compression fracture nor other acute bony abnormality of
the lumbar spine.

## 2017-01-31 ENCOUNTER — Ambulatory Visit: Payer: Commercial Managed Care - HMO

## 2017-02-01 ENCOUNTER — Encounter: Payer: Self-pay | Admitting: Internal Medicine

## 2017-05-27 ENCOUNTER — Encounter: Payer: Self-pay | Admitting: Cardiology

## 2017-05-31 ENCOUNTER — Encounter: Payer: Self-pay | Admitting: Internal Medicine

## 2017-06-17 ENCOUNTER — Telehealth: Payer: Self-pay | Admitting: Internal Medicine

## 2017-06-17 NOTE — Telephone Encounter (Signed)
New message   Patient calling she now lives in [place] she received a letter about her device please call c

## 2017-06-17 NOTE — Telephone Encounter (Signed)
Spoke with pt and informed her that I would take her out of our system d/t her living in [place] and having her device checked there.
